# Patient Record
Sex: Male | Born: 1965 | Race: Black or African American | Hispanic: No | Marital: Single | Smoking: Former smoker
Health system: Southern US, Community
[De-identification: ages and names within clinical notes are randomized; demographics above are authoritative.]

## PROBLEM LIST (undated history)

## (undated) DIAGNOSIS — I10 Essential (primary) hypertension: Secondary | ICD-10-CM

## (undated) DIAGNOSIS — I2510 Atherosclerotic heart disease of native coronary artery without angina pectoris: Secondary | ICD-10-CM

## (undated) DIAGNOSIS — R609 Edema, unspecified: Secondary | ICD-10-CM

## (undated) DIAGNOSIS — R600 Localized edema: Secondary | ICD-10-CM

## (undated) DIAGNOSIS — R0600 Dyspnea, unspecified: Secondary | ICD-10-CM

## (undated) DIAGNOSIS — N189 Chronic kidney disease, unspecified: Secondary | ICD-10-CM

## (undated) DIAGNOSIS — I517 Cardiomegaly: Secondary | ICD-10-CM

## (undated) DIAGNOSIS — I509 Heart failure, unspecified: Secondary | ICD-10-CM

## (undated) HISTORY — DX: Edema, unspecified: R60.9

## (undated) HISTORY — DX: Localized edema: R60.0

## (undated) HISTORY — PX: NO PAST SURGERIES: SHX2092

## (undated) HISTORY — DX: Essential (primary) hypertension: I10

## (undated) HISTORY — DX: Dyspnea, unspecified: R06.00

## (undated) HISTORY — DX: Cardiomegaly: I51.7

## (undated) HISTORY — DX: Heart failure, unspecified: I50.9

## (undated) HISTORY — DX: Chronic kidney disease, unspecified: N18.9

---

## 2009-05-21 ENCOUNTER — Encounter (INDEPENDENT_AMBULATORY_CARE_PROVIDER_SITE_OTHER): Payer: Self-pay | Admitting: Internal Medicine

## 2009-05-21 ENCOUNTER — Ambulatory Visit: Payer: Self-pay | Admitting: Cardiology

## 2009-05-21 ENCOUNTER — Inpatient Hospital Stay (HOSPITAL_COMMUNITY): Admission: EM | Admit: 2009-05-21 | Discharge: 2009-05-22 | Payer: Self-pay | Admitting: Emergency Medicine

## 2010-07-09 LAB — DIFFERENTIAL
Eosinophils Absolute: 0 10*3/uL (ref 0.0–0.7)
Eosinophils Relative: 0 % (ref 0–5)
Lymphs Abs: 0.9 10*3/uL (ref 0.7–4.0)
Monocytes Absolute: 0.6 10*3/uL (ref 0.1–1.0)
Neutro Abs: 10.7 10*3/uL — ABNORMAL HIGH (ref 1.7–7.7)

## 2010-07-09 LAB — BASIC METABOLIC PANEL
BUN: 20 mg/dL (ref 6–23)
Calcium: 8.4 mg/dL (ref 8.4–10.5)
Chloride: 101 mEq/L (ref 96–112)
Creatinine, Ser: 1.61 mg/dL — ABNORMAL HIGH (ref 0.4–1.5)
GFR calc Af Amer: 58 mL/min — ABNORMAL LOW (ref >60)
GFR calc non Af Amer: 48 mL/min — ABNORMAL LOW (ref >60)

## 2010-07-09 LAB — PROTEIN / CREATININE RATIO, URINE

## 2010-07-09 LAB — BRAIN NATRIURETIC PEPTIDE: Pro B Natriuretic peptide (BNP): >3200 pg/mL — ABNORMAL HIGH (ref 0.0–100.0)

## 2010-07-09 LAB — URINALYSIS, ROUTINE W REFLEX MICROSCOPIC
Bilirubin Urine: NEGATIVE
Glucose, UA: NEGATIVE mg/dL
Protein, ur: 100 mg/dL — AB
Urobilinogen, UA: 2 mg/dL — ABNORMAL HIGH (ref 0.0–1.0)
pH: 5.5 (ref 5.0–8.0)

## 2010-07-09 LAB — D-DIMER, QUANTITATIVE: D-Dimer, Quant: 4.01 ug/mL-FEU — ABNORMAL HIGH (ref 0.00–0.48)

## 2010-07-09 LAB — GLUCOSE, CAPILLARY
Glucose-Capillary: 112 mg/dL — ABNORMAL HIGH (ref 70–99)
Glucose-Capillary: 116 mg/dL — ABNORMAL HIGH (ref 70–99)
Glucose-Capillary: 121 mg/dL — ABNORMAL HIGH (ref 70–99)
Glucose-Capillary: 129 mg/dL — ABNORMAL HIGH (ref 70–99)
Glucose-Capillary: 152 mg/dL — ABNORMAL HIGH (ref 70–99)

## 2010-07-09 LAB — COMPREHENSIVE METABOLIC PANEL
AST: 36 U/L (ref 0–37)
Alkaline Phosphatase: 105 U/L (ref 39–117)
BUN: 19 mg/dL (ref 6–23)
Calcium: 8.9 mg/dL (ref 8.4–10.5)
GFR calc non Af Amer: 55 mL/min — ABNORMAL LOW (ref >60)
Glucose, Bld: 177 mg/dL — ABNORMAL HIGH (ref 70–99)
Sodium: 136 mEq/L (ref 135–145)
Total Protein: 7.1 g/dL (ref 6.0–8.3)

## 2010-07-09 LAB — CARDIAC PANEL(CRET KIN+CKTOT+MB+TROPI)
CK, MB: 1.7 ng/mL (ref 0.3–4.0)
Troponin I: 0.1 ng/mL — ABNORMAL HIGH (ref 0.00–0.06)

## 2010-07-09 LAB — HEMOGLOBIN A1C: Hgb A1c MFr Bld: 7 % — ABNORMAL HIGH (ref 4.6–6.1)

## 2010-07-09 LAB — RAPID URINE DRUG SCREEN, HOSP PERFORMED
Barbiturates: NOT DETECTED
Tetrahydrocannabinol: NOT DETECTED

## 2010-07-09 LAB — CULTURE, BLOOD (ROUTINE X 2): Culture: NO GROWTH

## 2010-07-09 LAB — CBC
HCT: 39.5 % (ref 39.0–52.0)
Hemoglobin: 12.9 g/dL — ABNORMAL LOW (ref 13.0–17.0)
MCHC: 32.7 g/dL (ref 30.0–36.0)
MCV: 84.8 fL (ref 78.0–100.0)
Platelets: 418 10*3/uL — ABNORMAL HIGH (ref 150–400)
Platelets: 520 10*3/uL — ABNORMAL HIGH (ref 150–400)
RBC: 4.12 MIL/uL — ABNORMAL LOW (ref 4.22–5.81)
WBC: 10.7 10*3/uL — ABNORMAL HIGH (ref 4.0–10.5)
WBC: 12.2 10*3/uL — ABNORMAL HIGH (ref 4.0–10.5)

## 2010-07-09 LAB — ETHANOL: Alcohol, Ethyl (B): <5 mg/dL (ref 0–10)

## 2010-07-09 LAB — CK TOTAL AND CKMB (NOT AT ARMC): Relative Index: 0.6 (ref 0.0–2.5)

## 2010-07-09 LAB — APTT: aPTT: 32 seconds (ref 24–37)

## 2010-07-09 LAB — PROTIME-INR: Prothrombin Time: 17.4 seconds — ABNORMAL HIGH (ref 11.6–15.2)

## 2010-07-09 LAB — POCT CARDIAC MARKERS
CKMB, poc: 1.7 ng/mL (ref 1.0–8.0)
Myoglobin, poc: 245 ng/mL (ref 12–200)

## 2010-07-09 LAB — TROPONIN I: Troponin I: 0.07 ng/mL — ABNORMAL HIGH (ref 0.00–0.06)

## 2010-10-19 IMAGING — CR DG CHEST 2V
2 series · 2 of 2 positions shown · non-contrast
Comparison: None.

CLINICAL DATA: 43-year-old male with shortness of breath and cough.

CHEST - 2 VIEW

[w chest pa]
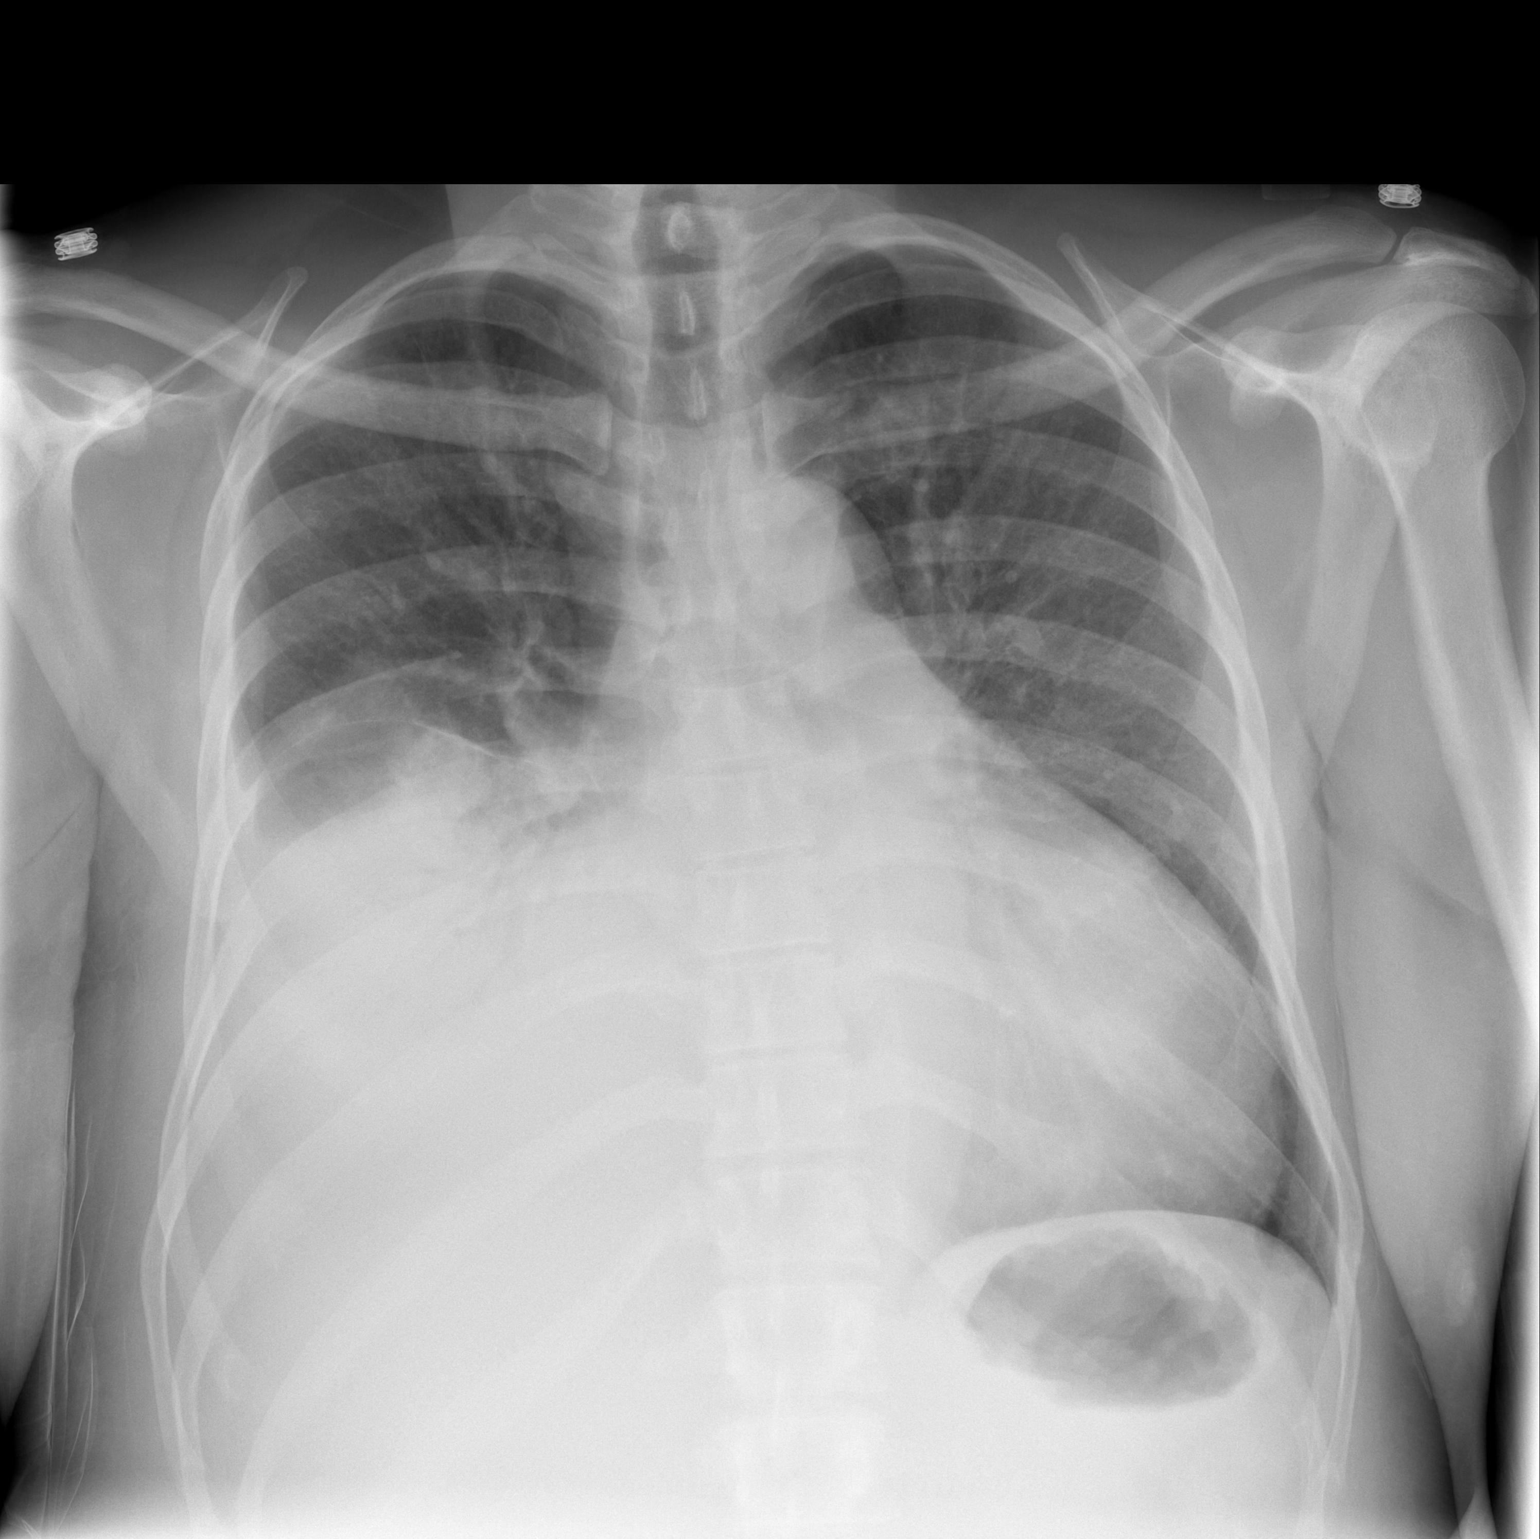

[w chest lat *]
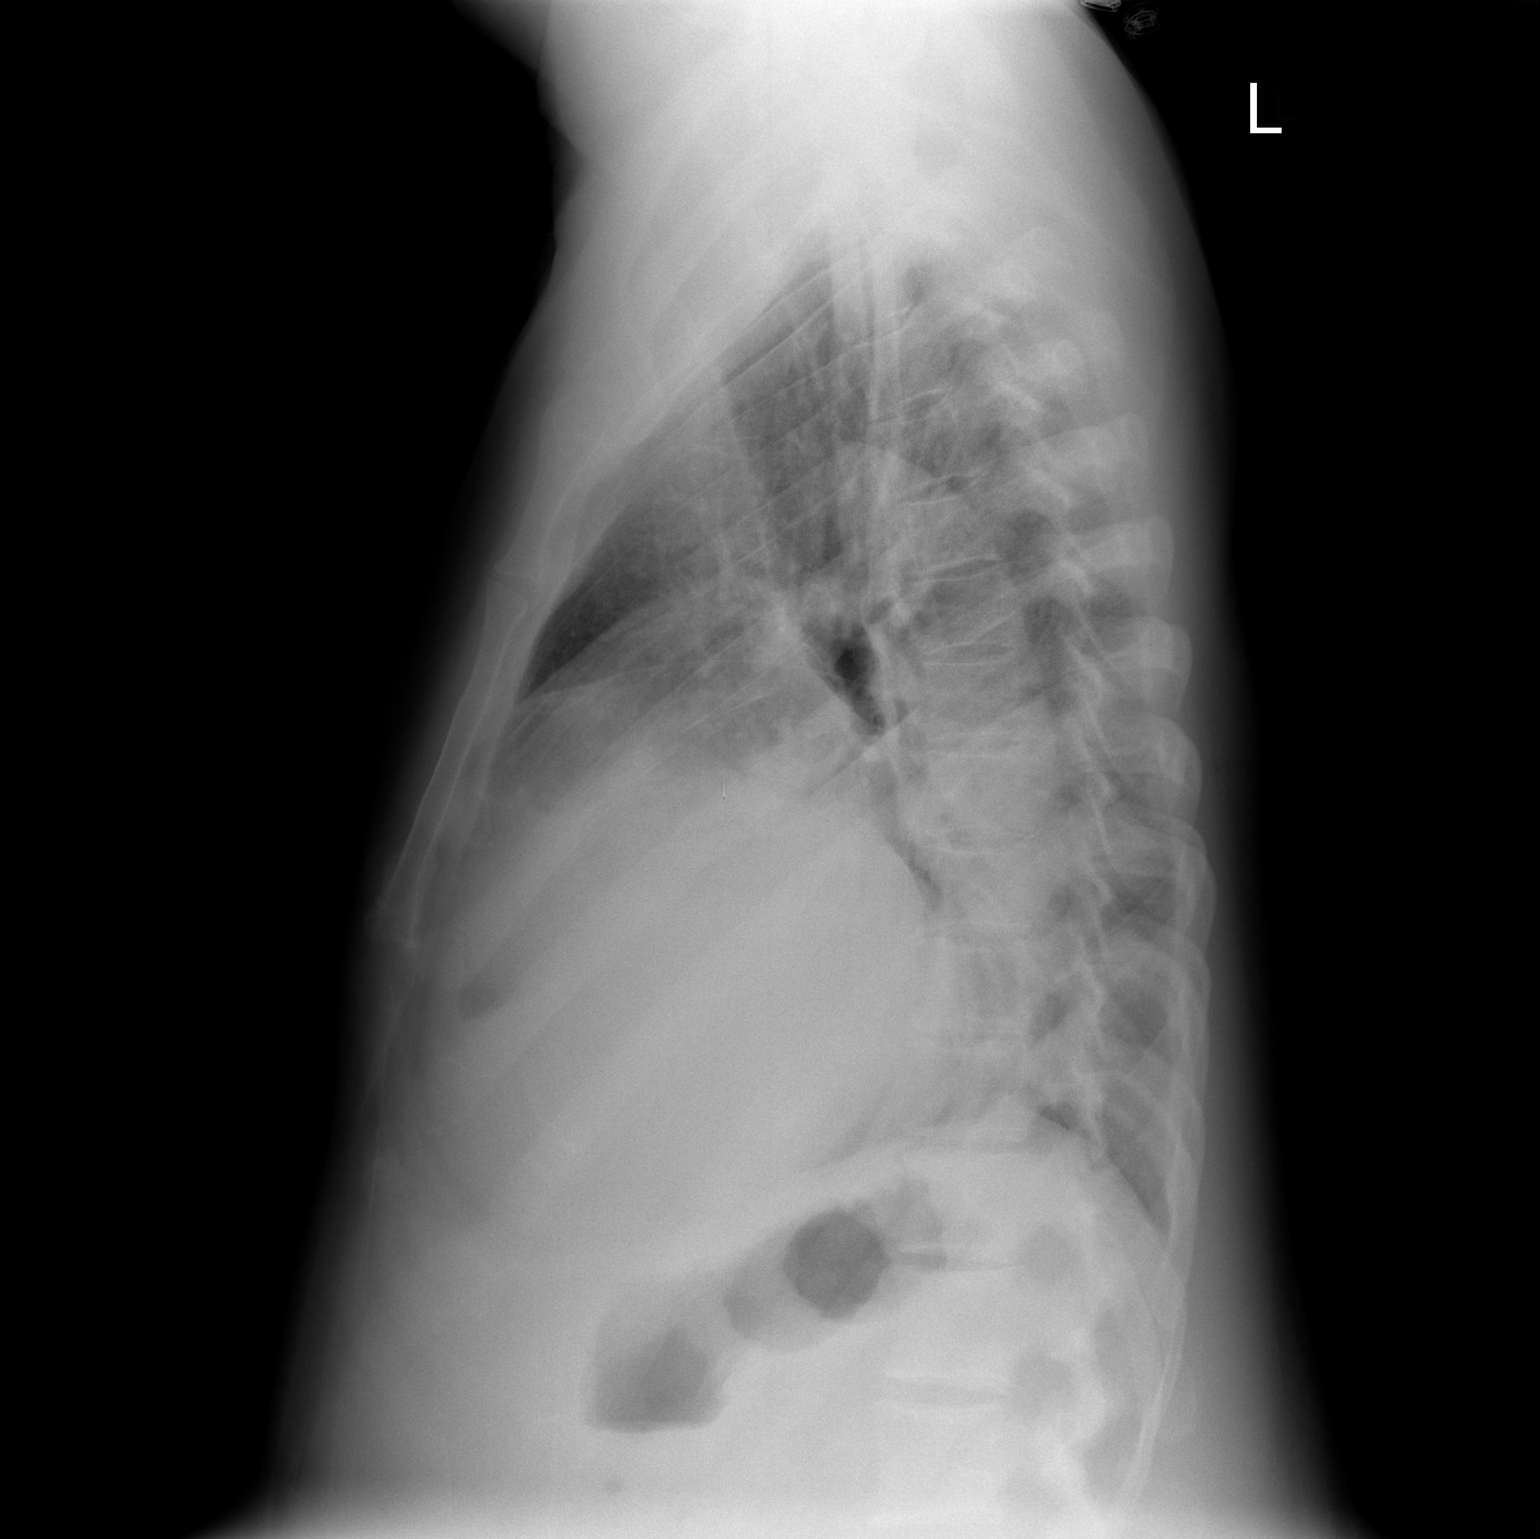

[2 of 2 positions shown; findings below may reference images not displayed]

FINDINGS: Marked cardiomegaly.  Dense opacification of the right
lung base with perihilar air bronchograms.  No pneumothorax.  Mild
increased interstitial opacity elsewhere suggestive of vascular
congestion.  No left pleural effusion.  Superior mediastinal
contours are normal. Visualized tracheal air column is within
normal limits.  No acute osseous abnormality identified.
IMPRESSION: 1.  Marked cardiomegaly.  Pericardial effusion not excluded.
2.  Dense opacification of the right middle and lower lobes with
perihilar air bronchograms.  The appearance could reflect pneumonia
and/or moderate pleural effusion.
3.  There may be mild interstitial edema superimposed.

## 2011-01-24 ENCOUNTER — Telehealth: Payer: Self-pay

## 2011-01-24 NOTE — Telephone Encounter (Signed)
Pt Signed ROI,Maile[facility] Records to pt 01/24/11/km

## 2012-11-09 ENCOUNTER — Ambulatory Visit (INDEPENDENT_AMBULATORY_CARE_PROVIDER_SITE_OTHER): Payer: Self-pay | Admitting: Cardiovascular Disease

## 2012-11-09 ENCOUNTER — Inpatient Hospital Stay (HOSPITAL_COMMUNITY)
Admission: RE | Admit: 2012-11-09 | Discharge: 2012-11-09 | Disposition: A | Discharge status: Home / Self Care | Payer: Self-pay | Source: Ambulatory Visit

## 2012-11-09 VITALS — BP 187/116 | HR 130 | Resp 28 | Ht 70.0 in | Wt 200.0 lb

## 2012-11-09 DIAGNOSIS — I5023 Acute on chronic systolic (congestive) heart failure: Secondary | ICD-10-CM

## 2012-11-09 DIAGNOSIS — I5043 Acute on chronic combined systolic (congestive) and diastolic (congestive) heart failure: Secondary | ICD-10-CM

## 2012-11-09 DIAGNOSIS — R9431 Abnormal electrocardiogram [ECG] [EKG]: Secondary | ICD-10-CM

## 2012-11-09 DIAGNOSIS — I10 Essential (primary) hypertension: Secondary | ICD-10-CM

## 2012-11-09 DIAGNOSIS — I509 Heart failure, unspecified: Secondary | ICD-10-CM

## 2012-11-09 DIAGNOSIS — Z79899 Other long term (current) drug therapy: Secondary | ICD-10-CM

## 2012-11-09 LAB — COMPREHENSIVE METABOLIC PANEL
AST: 46 U/L — ABNORMAL HIGH (ref 0–37)
Albumin: 3.2 g/dL — ABNORMAL LOW (ref 3.5–5.2)
Alkaline Phosphatase: 96 U/L (ref 39–117)
Calcium: 9.2 mg/dL (ref 8.4–10.5)
Chloride: 104 mEq/L (ref 96–112)
Glucose, Bld: 149 mg/dL — ABNORMAL HIGH (ref 70–99)
Potassium: 4.8 mEq/L (ref 3.5–5.3)
Sodium: 138 mEq/L (ref 135–145)
Total Protein: 6.6 g/dL (ref 6.0–8.3)

## 2012-11-09 MED ORDER — FUROSEMIDE 80 MG PO TABS: 80.0000 mg | ORAL_TABLET | Freq: Every day | ORAL | Status: DC

## 2012-11-09 MED ORDER — POTASSIUM CHLORIDE CRYS ER 20 MEQ PO TBCR: 20.0000 meq | EXTENDED_RELEASE_TABLET | Freq: Every day | ORAL | Status: DC

## 2012-11-09 MED ORDER — LISINOPRIL 20 MG PO TABS: 20.0000 mg | ORAL_TABLET | Freq: Every day | ORAL | Status: DC

## 2012-11-09 NOTE — Patient Instructions (Addendum)
Start Lisinopril 20mg  a daily.  Start Furosemide 80mg  daily.  Start Potassium daily.  Get labwork done today.  Return in 1 week.

## 2012-11-10 ENCOUNTER — Encounter: Payer: Self-pay | Admitting: Cardiovascular Disease

## 2012-11-10 DIAGNOSIS — I10 Essential (primary) hypertension: Secondary | ICD-10-CM | POA: Insufficient documentation

## 2012-11-10 DIAGNOSIS — I5043 Acute on chronic combined systolic (congestive) and diastolic (congestive) heart failure: Secondary | ICD-10-CM | POA: Insufficient documentation

## 2012-11-10 NOTE — Progress Notes (Signed)
Patient ID: Dwayne Cook, male   DOB: 1965/04/30, 47 y.o.   MRN: [id number]     Reason for office visit Acute CHF  Dwayne Cook is 47 years old and presents with severe acute exacerbation of pre-existing heart failure. He initially presented in 2011 with heart failure and had a brief in-hospital evaluation at [facility]. His echocardiogram showed an ejection fraction of 20% and there was evidence of at least moderate diastolic dysfunction. Coronary angiography was recommended but the patient declined. He was started on medical therapy and improved and eventually left against the advice of his physicians. He had severe systemic hypertension and proteinuria and hematuria and moderately elevated creatinine levels. From the patient and family's report it sounds like he was subsequently compliant with medical therapy for a while and did well. He changed his lifestyle. He became very judicious with use of sodium and began walking on a daily basis. He abstained from alcohol. He had pretty good functional status. Unfortunately he has been unemployed and unable to afford his medications or any followup. He is currently not taking any medications for hypertension or heart failure.  Over the last several weeks his condition has deteriorated. He initially developed shortness of breath on exertion but subsequently has had problems with orthopnea paroxysmal nocturnal dyspnea at least for several weeks. More recently has developed lower showed edema and finally allowed himself to be convinced to see a physician. It sounds like he is trying to deny many of his symptoms and the existence of his chronic illness. He seems very frustrated by the fact that his intense efforts at healthy lifestyle have not paid off. He staunchly denies any chest discomfort either in the remote or recent past.  A repeat echocardiogram performed in the office today shows an ejection fraction that is no more than 10%. The left ventricle  is moderately dilated with a end-diastolic diameter of about 6.5 cm the right heart is enlarged as well and there is at least mild right ventricular systolic dysfunction. He has mild to moderate aortic valve insufficiency, previously described as moderate in 2011. There is clear evidence of elevation in both left heart pressures and right heart pressures.  His electrocardiogram shows sinus tachycardia and left ventricular hypertrophy no Q waves are seen the QRS is slightly broadened only mild ST segment depression and T-wave inversions in leads 1 and aVL.     Allergies  Allergen Reactions  . Shellfish Allergy     Current Outpatient Prescriptions  Medication Sig Dispense Refill  . aspirin 81 MG tablet Take 81 mg by mouth daily.      . furosemide (LASIX) 80 MG tablet Take 1 tablet (80 mg total) by mouth daily.  90 tablet  3  . lisinopril (PRINIVIL,ZESTRIL) 20 MG tablet Take 1 tablet (20 mg total) by mouth daily.  90 tablet  3  . potassium chloride SA (K-DUR,KLOR-CON) 20 MEQ tablet Take 1 tablet (20 mEq total) by mouth daily.  90 tablet  3   No current facility-administered medications for this visit.    Past Medical History  Diagnosis Date  . Dyspnea   . Congestive heart failure   . Peripheral edema   . Cardiomegaly   . Hypertension   . Chronic kidney disease     History reviewed. No pertinent past surgical history.  Family History  Problem Relation Age of Onset  . Hypertension Mother     History   Social History  . Marital Status: Single    Spouse  Name: N/A    Number of Children: N/A  . Years of Education: N/A   Occupational History  . Not on file.   Social History Main Topics  . Smoking status: Former Smoker -- 1.00 packs/day for 15 years    Types: Cigarettes    Quit date: 04/18/2006  . Smokeless tobacco: Never Used  . Alcohol Use: 0 - .5 oz/week    0-1 drink(s) per week     Comment: occasional per pt  . Drug Use: No  . Sexually Active: Not on file   Other  Topics Concern  . Not on file   Social History Narrative  . No narrative on file    Review of systems: He has severe dyspnea at rest and is unable to lie flat. Even lying at 45 head of bed elevation is very difficult for him. He can only do it for a few minutes and has to sit up. He has severe dyspnea on exertion. He denies chest pain. He has not had syncope dizziness lightheadedness or focal cortical deficits. Moderate ankle edema has developed just in the last few days. He does not have intermittent claudication or rectal dysfunction. He denies flank pain or hematuria dysuria polyuria or polydipsia. He denies abdominal pain change in bowel habits dysphagia nausea or vomiting. Has not had cough wheezing or hemoptysis. He denies fever or chills. He has not been monitoring his weight. He describes several symptoms suggestive of depression but when asked whether he feels depressed he denied this. He denies any suicidal thoughts. He has not had any skin rashes.  PHYSICAL EXAM BP 187/116  Pulse 130  Resp 28  Ht 5\' 10"  (1.778 m)  Wt 200 lb (90.719 kg)  BMI 28.7 kg/m2  General: Alert, oriented x3, mild respiratory distress Head: no evidence of trauma, PERRL, EOMI, no exophtalmos or lid lag, no myxedema, no xanthelasma; normal ears, nose and oropharynx Neck: 6-7 cm elevation in jugular venous pulsations and prompt hepatojugular reflux; brisk carotid pulses without delay and no carotid bruits Chest: clear to auscultation, no signs of consolidation by percussion or palpation, normal fremitus, symmetrical and full respiratory excursions Cardiovascular: The apical impulse is laterally and inferiorly displaced and markedly hyperdynamic. There is a right parasternal heave, the liver is not palpable, regular rhythm, normal first and second heart sounds, no murmurs, loud summation gallop is heard Abdomen: no tenderness or distention, no masses by palpation, no abnormal pulsatility or arterial bruits, normal  bowel sounds, the liver is slightly enlarged a 2 cm beneath right costal margin but is not pulsatile, the spleen is not palpable Extremities: no clubbing, cyanosis; there is 2+ bilateral symmetrical pitting edema halfway up the shins; 2+ radial, ulnar and brachial pulses bilaterally; 2+ right femoral, posterior tibial and dorsalis pedis pulses; 2+ left femoral, posterior tibial and dorsalis pedis pulses; no subclavian or femoral bruits Neurological: grossly nonfocal   EKG: Sinus tachycardia, left ventricular hypertrophy with secondary repolarization abnormality, probable biatrial enlargement  Lipid Panel  No results found for this basename: chol, trig, hdl, cholhdl, vldl, ldlcalc    BMET    Component Value Date/Time   NA 138 11/09/2012 1346   K 4.8 11/09/2012 1346   CL 104 11/09/2012 1346   CO2 19 11/09/2012 1346   GLUCOSE 149* 11/09/2012 1346   BUN 17 11/09/2012 1346   CREATININE 1.73* 11/09/2012 1346   CREATININE 1.61* 05/22/2009 0420   CALCIUM 9.2 11/09/2012 1346   GFRNONAA 48* 05/22/2009 0420   GFRAA  Value:  58        The eGFR has been calculated using the MDRD equation. This calculation has not been validated in all clinical situations. eGFR's persistently <60 mL/min signify possible Chronic Kidney Disease.* 05/22/2009 2952     ASSESSMENT AND PLAN Acute on chronic combined systolic and diastolic CHF, NYHA class 4 He is severely decompensated and appears to be in chronic pulmonary edema. I have recommended hospitalization but he clearly prefers outpatient management first. He seemed to do quite well in the past when he was on high doses of ACE inhibitors and beta blockers, hopefully we can achieve a similar degree of compensation again. It is unclear whether he has idiopathic dilated cardiomyopathy and systemic hypertension or whether his cardiomyopathy is entirely secondary to malignant hypertension. He does not really have that much left ventricular hypertrophy. We will begin lisinopril 20 mg  once daily and furosemide with potassium supplements. He is in no shape to receive beta blockers yet. Reevaluate next week. Check labs today. Suspect she will need at least 3 agents for blood pressure control. Reevaluate left ventricular ejection fraction in 3 months. He may remain an ICD candidate. Life vest is a consideration, but I am not sure how we will manage the financial aspects of this. Long-term prognosis is guarded. I would favor early referral to heart failure clinic. He may require advanced heart failure therapies such as consideration for transplantation and LVAD. Reinforced the need for strict sodium restriction, daily weight monitoring and keeping a record of his weights and bring it to his next office appointment.  CKD (chronic kidney disease), stage III Laboratory tests from 3 years ago suggest he already has renal impairment. Repeat labs today. 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  HTN (hypertension), malignant He has severe systemic hypertension. We'll start off with ACE inhibitors implant to try to titrate these to maximum dosage if renal function allows. Then will add carvedilol once he is closer to euvolemic status. He may require a third agent such as amlodipine or for better yet, hydralazine nitrates.   Orders Placed This Encounter  Procedures  . Comp Met (CMET)  . 2D Echocardiogram with contrast   Meds ordered this encounter  Medications  . lisinopril (PRINIVIL,ZESTRIL) 20 MG tablet    Sig: Take 1 tablet (20 mg total) by mouth daily.    Dispense:  90 tablet    Refill:  3  . furosemide (LASIX) 80 MG tablet    Sig: Take 1 tablet (80 mg total) by mouth daily.    Dispense:  90 tablet    Refill:  3  . potassium chloride SA (K-DUR,KLOR-CON) 20 MEQ tablet    Sig: Take 1 tablet (20 mEq total) by mouth daily.    Dispense:  90 tablet    Refill:  3  . aspirin 81 MG tablet    Sig: Take 81 mg by mouth daily.    Junious Silk, MD,  [facility] [phone] office [phone] pager

## 2012-11-10 NOTE — Assessment & Plan Note (Signed)
Laboratory tests from 3 years ago suggest he already has renal impairment. Repeat labs today. 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

## 2012-11-10 NOTE — Assessment & Plan Note (Signed)
He has severe systemic hypertension. We'll start off with ACE inhibitors implant to try to titrate these to maximum dosage if renal function allows. Then will add carvedilol once he is closer to euvolemic status. He may require a third agent such as amlodipine or for better yet, hydralazine nitrates.

## 2012-11-10 NOTE — Assessment & Plan Note (Addendum)
He is severely decompensated and appears to be in chronic pulmonary edema. I have recommended hospitalization but he clearly prefers outpatient management first. He seemed to do quite well in the past when he was on high doses of ACE inhibitors and beta blockers, hopefully we can achieve a similar degree of compensation again. It is unclear whether he has idiopathic dilated cardiomyopathy and systemic hypertension or whether his cardiomyopathy is entirely secondary to malignant hypertension. He does not really have that much left ventricular hypertrophy. We will begin lisinopril 20 mg once daily and furosemide with potassium supplements. He is in no shape to receive beta blockers yet. Reevaluate next week. Check labs today. Suspect she will need at least 3 agents for blood pressure control. Reevaluate left ventricular ejection fraction in 3 months. He may remain an ICD candidate. Life vest is a consideration, but I am not sure how we will manage the financial aspects of this. Long-term prognosis is guarded. I would favor early referral to heart failure clinic. He may require advanced heart failure therapies such as consideration for transplantation and LVAD. Reinforced the need for strict sodium restriction, daily weight monitoring and keeping a record of his weights and bring it to his next office appointment.

## 2012-11-14 ENCOUNTER — Encounter: Payer: Self-pay | Admitting: Cardiovascular Disease

## 2012-11-14 ENCOUNTER — Ambulatory Visit: Payer: Self-pay | Admitting: Cardiovascular Disease

## 2012-11-20 ENCOUNTER — Ambulatory Visit (INDEPENDENT_AMBULATORY_CARE_PROVIDER_SITE_OTHER): Payer: Self-pay | Admitting: Cardiovascular Disease

## 2012-11-20 ENCOUNTER — Encounter: Payer: Self-pay | Admitting: Cardiovascular Disease

## 2012-11-20 VITALS — BP 166/100 | HR 124 | Resp 16 | Ht 70.0 in | Wt 194.0 lb

## 2012-11-20 DIAGNOSIS — I10 Essential (primary) hypertension: Secondary | ICD-10-CM

## 2012-11-20 DIAGNOSIS — I5021 Acute systolic (congestive) heart failure: Secondary | ICD-10-CM

## 2012-11-20 DIAGNOSIS — I5043 Acute on chronic combined systolic (congestive) and diastolic (congestive) heart failure: Secondary | ICD-10-CM

## 2012-11-20 DIAGNOSIS — I509 Heart failure, unspecified: Secondary | ICD-10-CM

## 2012-11-20 MED ORDER — CARVEDILOL 6.25 MG PO TABS: 6.2500 mg | ORAL_TABLET | Freq: Two times a day (BID) | ORAL | Status: DC

## 2012-11-20 NOTE — Patient Instructions (Addendum)
Your physician has recommended you make the following change in your medication: start carvedilol 3.125 mg twice a day (half a tablet twice a day). Your physician recommends that you weigh, daily, at the same time every day, and in the same amount of clothing. Please record your daily weights on the handout provided and bring it to your next appointment.  Please call right away if you develop worsening swelling, shortness of breath, dizziness or blackouts.  Your physician recommends that you schedule a follow-up appointment in: 1 month

## 2012-11-21 ENCOUNTER — Encounter: Payer: Self-pay | Admitting: Cardiovascular Disease

## 2012-11-21 NOTE — Assessment & Plan Note (Addendum)
He has severe left ventricular systolic dysfunction with an ejection fraction of less than 15%. When he initially presented he was markedly orthopneic and had paroxysmal nocturnal dyspnea. Both of these symptoms have now resolved. He is able to lie flat in bed and sleep. At home his heart rate was 90 beats per minutes but his blood pressure was still elevated. He seems to be really apprehensive about being in any physician's office. Today he again looks very nervous but is clearly breathing more comfortably. His jugular venous pulsations are not elevated and he has had no further problems with edema. He seems to be close to euvolemic status.  I am rather concerned about the fact that he is so tachycardic today, but I believe that the beneficial effects of the beta blocker related blood pressure reduction will outweigh the immediate acute negative inotropic effects. All in all, I think he'll benefit from starting carvedilol. He'll begin 3.125 mg twice daily, although I gave him a prescription for the 6.25 mg tablets which he will have to break in half. I told him that he may transiently feel worse after starting this treatment and asked him to immediately call should he develop worsening shortness of breath, recurrent edema, orthopnea or PND. I doubt that he will become hypotensive on such a small dose. We will plan to increase his dose to 6.25 mg twice a day in 2 weeks if he tolerates beta blocker initiation well. Otherwise we may leave him at the current low dose in increase the ACE inhibitor or add hydralazine/nitrate. He is to continue weighing himself daily. On his home scale his weight is 190 pounds, on office scale 194 pounds

## 2012-11-21 NOTE — Assessment & Plan Note (Signed)
Recheck a metabolic panel at his next appointment

## 2012-11-21 NOTE — Assessment & Plan Note (Signed)
His blood pressure today severely elevated. Checked at home by his daughter who is a CMA, his blood pressure has been as low as 120/90 mm Hg.

## 2012-11-21 NOTE — Progress Notes (Signed)
Patient ID: Dwayne Cook, male   DOB: 05-21-1965, 47 y.o.   MRN: [id number]     Reason for office visit Congestive heart failure follow up  On once a daily 40 mg furosemide treatment he did not have significant diuresis so we increased it to 80 mg daily. Also increase his lisinopril dose after his last visit. He has had significant diuresis. As lost about 10 pounds of fluid. He no longer has orthopnea and are social and dyspnea.  His blood pressure has decreased gradually but the lowest recorded was 120/19 mm Hg at home. His heart rate has been as low as 90 beats per minute at home. Today he is tachycardic.    Allergies  Allergen Reactions  . Shellfish Allergy     Current Outpatient Prescriptions  Medication Sig Dispense Refill  . aspirin 81 MG tablet Take 81 mg by mouth daily.      . furosemide (LASIX) 80 MG tablet Take 1 tablet (80 mg total) by mouth daily.  90 tablet  3  . lisinopril (PRINIVIL,ZESTRIL) 20 MG tablet Take 1 tablet (20 mg total) by mouth daily.  90 tablet  3  . potassium chloride SA (K-DUR,KLOR-CON) 20 MEQ tablet Take 1 tablet (20 mEq total) by mouth daily.  90 tablet  3  . carvedilol (COREG) 6.25 MG tablet Take 1 tablet (6.25 mg total) by mouth 2 (two) times daily.  180 tablet  3   No current facility-administered medications for this visit.    Past Medical History  Diagnosis Date  . Dyspnea   . Congestive heart failure   . Peripheral edema   . Cardiomegaly   . Hypertension   . Chronic kidney disease     No past surgical history on file.  Family History  Problem Relation Age of Onset  . Hypertension Mother     History   Social History  . Marital Status: Single    Spouse Name: N/A    Number of Children: N/A  . Years of Education: N/A   Occupational History  . Not on file.   Social History Main Topics  . Smoking status: Former Smoker -- 1.00 packs/day for 15 years    Types: Cigarettes    Quit date: 04/18/2006  . Smokeless tobacco: Never  Used  . Alcohol Use: 0 - .5 oz/week    0-1 drink(s) per week     Comment: occasional per pt  . Drug Use: No  . Sexually Active: Not on file   Other Topics Concern  . Not on file   Social History Narrative  . No narrative on file    Review of systems: The patient specifically denies any chest pain at rest or with exertion,  orthopnea, paroxysmal nocturnal dyspnea, syncope, palpitations, focal neurological deficits, intermittent claudication, lower extremity edema, unexplained weight gain, cough, hemoptysis or wheezing.  The patient also denies abdominal pain, nausea, vomiting, dysphagia, diarrhea, constipation, polyuria, polydipsia, dysuria, hematuria, frequency, urgency, abnormal bleeding or bruising, fever, chills, unexpected weight changes, mood swings, change in skin or hair texture, change in voice quality, auditory or visual problems, allergic reactions or rashes, new musculoskeletal complaints other than usual "aches and pains".   PHYSICAL EXAM BP 166/100  Pulse 124  Resp 16  Ht 5\' 10"  (1.778 m)  Wt 194 lb (87.998 kg)  BMI 27.84 kg/m2  General: Alert, oriented x3, no distress Head: no evidence of trauma, PERRL, EOMI, no exophtalmos or lid lag, no myxedema, no xanthelasma; normal ears, nose and oropharynx  Neck: normal jugular venous pulsations and no hepatojugular reflux; brisk carotid pulses without delay and no carotid bruits Chest: clear to auscultation, no signs of consolidation by percussion or palpation, normal fremitus, symmetrical and full respiratory excursions Cardiovascular: Tachycardic Laterally and inferiorly displaced and effaced apical impulse, parasternal impulse has diminished, regular rhythm, normal first and second heart sounds, no murmurs, rubs or gallops Abdomen: no tenderness or distention, no masses by palpation, no abnormal pulsatility or arterial bruits, normal bowel sounds, no hepatosplenomegaly Extremities: no clubbing, cyanosis or edema; 2+ radial,  ulnar and brachial pulses bilaterally; 2+ right femoral, posterior tibial and dorsalis pedis pulses; 2+ left femoral, posterior tibial and dorsalis pedis pulses; no subclavian or femoral bruits Neurological: grossly nonfocal  EKG: Sinus tachycardia, left ventricular hypertrophy with repolarization abnormalities.  Lipid Panel    BMET    Component Value Date/Time   NA 138 11/09/2012 1346   K 4.8 11/09/2012 1346   CL 104 11/09/2012 1346   CO2 19 11/09/2012 1346   GLUCOSE 149* 11/09/2012 1346   BUN 17 11/09/2012 1346   CREATININE 1.73* 11/09/2012 1346   CREATININE 1.61* 05/22/2009 0420   CALCIUM 9.2 11/09/2012 1346   GFRNONAA 48* 05/22/2009 0420   GFRAA  Value: 58        The eGFR has been calculated using the MDRD equation. This calculation has not been validated in all clinical situations. eGFR's persistently <60 mL/min signify possible Chronic Kidney Disease.* 05/22/2009 1610     ASSESSMENT AND PLAN Acute on chronic combined systolic and diastolic CHF, NYHA class 3 He has severe left ventricular systolic dysfunction with an ejection fraction of less than 15%. When he initially presented he was markedly orthopneic and had paroxysmal nocturnal dyspnea. Both of these symptoms have now resolved. He is able to lie flat in bed and sleep. At home his heart rate was 90 beats per minutes but his blood pressure was still quite elevated. He seems to be really apprehensive about being in any physician's office. Today he again looks very nervous but is clearly breathing more comfortably. His jugular venous pulsations are not elevated and he has had no further problems with edema. He seems to be close to euvolemic status. I am rather concerned about the fact that he is so tachycardic today, but I believe that the beneficial effects of the beta blocker related blood pressure reduction will outweigh the immediate acute negative inotropic effects. All in all I think you'll benefit from starting carvedilol. He'll begin  3.125 mg twice daily, although I gave him a prescription for the 6.25 mg tablets which she will have to break in half. I told him that he may transiently feel worse after starting this treatment and asked him to immediately call should he develop worsening shortness of breath, recurrent edema, orthopnea or PND. I doubt that he will become hypotensive on such a small dose. We will plan to increase his dose to 6.25 mg twice a day in 2 weeks if he tolerates beta blocker initiation well. Otherwise we may leave him at the current low dose in increase the ACE inhibitor or add hydralazine/nitrate.  CKD (chronic kidney disease), stage III Recheck a metabolic panel at his next appointment  HTN (hypertension), malignant His blood pressure today severely elevated. Checked at home by his daughter who is a CMA, his blood pressure has been as low as 120/90 mm Hg.  Orders Placed This Encounter  Procedures  . EKG 12-Lead   Meds ordered this encounter  Medications  .  carvedilol (COREG) 6.25 MG tablet    Sig: Take 1 tablet (6.25 mg total) by mouth 2 (two) times daily.    Dispense:  180 tablet    Refill:  3    Gorman Safi  [name], MD, [facility] [phone] office [phone] pager

## 2012-11-27 ENCOUNTER — Telehealth: Payer: Self-pay | Admitting: *Deleted

## 2012-11-27 MED ORDER — ISOSORB DINITRATE-HYDRALAZINE 20-37.5 MG PO TABS: 1.0000 | ORAL_TABLET | Freq: Three times a day (TID) | ORAL | Status: DC

## 2012-11-27 NOTE — Telephone Encounter (Signed)
Samples of Bidil given

## 2012-11-30 ENCOUNTER — Other Ambulatory Visit: Payer: Self-pay | Admitting: *Deleted

## 2012-11-30 MED ORDER — LISINOPRIL 40 MG PO TABS: 40.0000 mg | ORAL_TABLET | Freq: Every day | ORAL | Status: DC

## 2012-12-21 ENCOUNTER — Telehealth: Payer: Self-pay | Admitting: *Deleted

## 2012-12-21 DIAGNOSIS — Z79899 Other long term (current) drug therapy: Secondary | ICD-10-CM

## 2012-12-21 NOTE — Telephone Encounter (Signed)
Per dr C order bmp

## 2012-12-22 LAB — BASIC METABOLIC PANEL
BUN: 21 mg/dL (ref 6–23)
Creat: 1.7 mg/dL — ABNORMAL HIGH (ref 0.50–1.35)
Potassium: 4.9 mEq/L (ref 3.5–5.3)

## 2012-12-25 ENCOUNTER — Ambulatory Visit: Payer: Self-pay | Admitting: Cardiovascular Disease

## 2013-02-26 ENCOUNTER — Telehealth: Payer: Self-pay | Admitting: Cardiovascular Disease

## 2013-02-26 MED ORDER — HYDRALAZINE HCL 50 MG PO TABS: 50.0000 mg | ORAL_TABLET | Freq: Three times a day (TID) | ORAL | Status: DC

## 2013-02-26 MED ORDER — ISOSORBIDE DINITRATE 20 MG PO TABS: 30.0000 mg | ORAL_TABLET | Freq: Three times a day (TID) | ORAL | Status: DC

## 2013-02-26 NOTE — Telephone Encounter (Signed)
Change bidil to generic drugs

## 2013-03-12 ENCOUNTER — Ambulatory Visit (INDEPENDENT_AMBULATORY_CARE_PROVIDER_SITE_OTHER): Payer: Self-pay | Admitting: Cardiovascular Disease

## 2013-03-12 ENCOUNTER — Encounter: Payer: Self-pay | Admitting: Cardiovascular Disease

## 2013-03-12 VITALS — BP 188/128 | HR 117 | Ht 70.0 in | Wt 187.2 lb

## 2013-03-12 DIAGNOSIS — I10 Essential (primary) hypertension: Secondary | ICD-10-CM

## 2013-03-12 DIAGNOSIS — I509 Heart failure, unspecified: Secondary | ICD-10-CM

## 2013-03-12 DIAGNOSIS — N183 Chronic kidney disease, stage 3 unspecified: Secondary | ICD-10-CM

## 2013-03-12 DIAGNOSIS — I5043 Acute on chronic combined systolic (congestive) and diastolic (congestive) heart failure: Secondary | ICD-10-CM

## 2013-03-12 MED ORDER — CARVEDILOL 25 MG PO TABS: 25.0000 mg | ORAL_TABLET | Freq: Two times a day (BID) | ORAL | Status: DC

## 2013-03-12 NOTE — Patient Instructions (Signed)
Dr. Royann Shivers has requested that you have an echocardiogram. Echocardiography is a painless test that uses sound waves to create images of your heart. It provides your doctor with information about the size and shape of your heart and how well your heart's chambers and valves are working. This procedure takes approximately one hour. There are no restrictions for this procedure.  Schedule an appointment the same day as your echocardiogram.

## 2013-03-16 NOTE — Assessment & Plan Note (Signed)
Dwayne Cook appears to be over the worst of the acute heart failure but still has physical findings suggestive of severe cardiomyopathy and elevated filling pressures. He seems to have exclusively left heart failure findings at this time. His blood pressure remains unacceptably high. We will increase his carvedilol to 25 mg twice a day. When we last increased the carvedilol he had a brief period with nausea but overcame this. If his blood pressure still high we'll also add Spiriva lactone. This has to be done very cautiously during simultaneous treatment with an ACE inhibitor and a baseline creatinine of 1.7. The cause of his cardiomyopathy appears to be most likely untreated malignant hypertension, but he has never undergone evaluation for coronary disease. 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 Discussed the fact it would need to reevaluate left ventricular ejection fraction and then make a decision regarding defibrillator therapy. Prior to implantation of a defibrillator it would still be wise to do coronary angiography. This could be done on the same admission. I do not think he will ever be able to be gainfully employed in a profession that involves even the slightest physical activity or even a "sit down" job. I think he should pursue disability.

## 2013-03-16 NOTE — Assessment & Plan Note (Signed)
Creatinine stable around 1.7, GFR of approximately 50 mL per minute

## 2013-03-16 NOTE — Progress Notes (Signed)
Patient ID: Dashton Czerwinski, male   DOB: 1965-05-24, 47 y.o.   MRN: [id number]      Reason for office visit Followup congestive heart failure  Mr. Flett returns for routine followup and has not had any new adverse events. The subjectively he has noted marked improvement. Today his blood pressure heart rate are high, but as always he states that he is very nervous in the doctor's office. He did not take his carvedilol gets today. At home his daughter, who is a CMA has recorded blood pressures are typically around 120/94 mm Hg and his heart rate is usually 80-90 beats per minute. He denies issues with syncope or dizziness. He has not had edema. He does not have orthopnea or PND. He has not had palpitations. He puts on a brace face and wants to deny symptoms but he appears to have functional class III shortness of breath on exertion. He became short of breath walking from the waiting area to the examination room today.   Allergies  Allergen Reactions  . Shellfish Allergy     Current Outpatient Prescriptions  Medication Sig Dispense Refill  . aspirin 81 MG tablet Take 325 mg by mouth daily.       . furosemide (LASIX) 80 MG tablet Take 1 tablet (80 mg total) by mouth daily.  90 tablet  3  . hydrALAZINE (APRESOLINE) 50 MG tablet Take 1 tablet (50 mg total) by mouth 3 (three) times daily.  90 tablet  11  . isosorbide dinitrate (ISORDIL) 20 MG tablet Take 20 mg by mouth 3 (three) times daily.      Marland Kitchen lisinopril (PRINIVIL,ZESTRIL) 40 MG tablet Take 1 tablet (40 mg total) by mouth daily.  30 tablet  11  . potassium chloride SA (K-DUR,KLOR-CON) 20 MEQ tablet Take 1 tablet (20 mEq total) by mouth daily.  90 tablet  3  . carvedilol (COREG) 25 MG tablet Take 1 tablet (25 mg total) by mouth 2 (two) times daily.  60 tablet  6   No current facility-administered medications for this visit.    Past Medical History  Diagnosis Date  . Dyspnea   . Congestive heart failure   . Peripheral edema   .  Cardiomegaly   . Hypertension   . Chronic kidney disease     No past surgical history on file.  Family History  Problem Relation Age of Onset  . Hypertension Mother     History   Social History  . Marital Status: Single    Spouse Name: N/A    Number of Children: N/A  . Years of Education: N/A   Occupational History  . Not on file.   Social History Main Topics  . Smoking status: Former Smoker -- 1.00 packs/day for 15 years    Types: Cigarettes    Quit date: 04/18/2006  . Smokeless tobacco: Never Used  . Alcohol Use: 0 - .5 oz/week    0-1 drink(s) per week     Comment: occasional per pt  . Drug Use: No  . Sexual Activity: Not on file   Other Topics Concern  . Not on file   Social History Narrative  . No narrative on file    Review of systems: The patient specifically denies any chest pain at rest or with exertion, dyspnea at rest or with exertion, orthopnea, paroxysmal nocturnal dyspnea, syncope, palpitations, focal neurological deficits, intermittent claudication, lower extremity edema, unexplained weight gain, cough, hemoptysis or wheezing.  The patient also denies abdominal pain, nausea,  vomiting, dysphagia, diarrhea, constipation, polyuria, polydipsia, dysuria, hematuria, frequency, urgency, abnormal bleeding or bruising, fever, chills, unexpected weight changes, mood swings, change in skin or hair texture, change in voice quality, auditory or visual problems, allergic reactions or rashes, new musculoskeletal complaints other than usual "aches and pains".   PHYSICAL EXAM BP 188/128  Pulse 117  Ht 5\' 10"  (1.778 m)  Wt 187 lb 3.2 oz (84.913 kg)  BMI 26.86 kg/m2  General: Alert, oriented x3, no distress Head: no evidence of trauma, PERRL, EOMI, no exophtalmos or lid lag, no myxedema, no xanthelasma; normal ears, nose and oropharynx Neck: normal jugular venous pulsations and no hepatojugular reflux; brisk carotid pulses without delay and no carotid bruits Chest:  clear to auscultation, no signs of consolidation by percussion or palpation, normal fremitus, symmetrical and full respiratory excursions Cardiovascular: the apical impulse is displaced inferiorly to the seventh intercostal space in the anterior axillary line and has a heating quality, there is also a precordial heave, regular rhythm, normal first and second heart sounds, no murmurs, summation gallop is present Abdomen: no tenderness or distention, no masses by palpation, no abnormal pulsatility or arterial bruits, normal bowel sounds, no hepatosplenomegaly Extremities: no clubbing, cyanosis or edema; 2+ radial, ulnar and brachial pulses bilaterally; 2+ right femoral, posterior tibial and dorsalis pedis pulses; 2+ left femoral, posterior tibial and dorsalis pedis pulses; no subclavian or femoral bruits Neurological: grossly nonfocal   EKG: Sinus tachycardia, LVH, prominent repolarization abnormalities.  Lipid Panel  No results found for this basename: chol, trig, hdl, cholhdl, vldl, ldlcalc    BMET    Component Value Date/Time   NA 140 12/21/2012 1518   K 4.9 12/21/2012 1518   CL 106 12/21/2012 1518   CO2 24 12/21/2012 1518   GLUCOSE 88 12/21/2012 1518   BUN 21 12/21/2012 1518   CREATININE 1.70* 12/21/2012 1518   CREATININE 1.61* 05/22/2009 0420   CALCIUM 10.1 12/21/2012 1518   GFRNONAA 48* 05/22/2009 0420   GFRAA  Value: 58        The eGFR has been calculated using the MDRD equation. This calculation has not been validated in all clinical situations. eGFR's persistently <60 mL/min signify possible Chronic Kidney Disease.* 05/22/2009 1610     ASSESSMENT AND PLAN Acute on chronic combined systolic and diastolic CHF, NYHA class 3 Mr. Voorhies appears to be over the worst of the acute heart failure but still has physical findings suggestive of severe cardiomyopathy and elevated filling pressures. He seems to have exclusively left heart failure findings at this time. His blood pressure remains unacceptably  high. We will increase his carvedilol to 25 mg twice a day. When we last increased the carvedilol he had a brief period with nausea but overcame this. If his blood pressure still high we'll also add Spiriva lactone. This has to be done very cautiously during simultaneous treatment with an ACE inhibitor and a baseline creatinine of 1.7. The cause of his cardiomyopathy appears to be most likely untreated malignant hypertension, but he has never undergone evaluation for coronary disease. 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 Discussed the fact it would need to reevaluate left ventricular ejection fraction and then make a decision regarding defibrillator therapy. Prior to implantation of a defibrillator it would still be wise to do coronary angiography. This could be done on the same admission. I do not think he will ever be able to be gainfully employed in a profession that involves even the slightest physical activity  or even a "sit down" job. I think he should pursue disability.  CKD (chronic kidney disease), stage III Creatinine stable around 1.7, GFR of approximately 50 mL per minute  HTN (hypertension), malignant     Orders Placed This Encounter  Procedures  . EKG 12-Lead  . 2D Echocardiogram without contrast   Meds ordered this encounter  Medications  . DISCONTD: carvedilol (COREG) 6.25 MG tablet    Sig: Take 12.5 mg by mouth 2 (two) times daily.  . isosorbide dinitrate (ISORDIL) 20 MG tablet    Sig: Take 20 mg by mouth 3 (three) times daily.  . carvedilol (COREG) 25 MG tablet    Sig: Take 1 tablet (25 mg total) by mouth 2 (two) times daily.    Dispense:  60 tablet    Refill:  6    Claudia Greenley  [name], MD, [facility] [phone] office [phone] pager

## 2013-03-29 ENCOUNTER — Other Ambulatory Visit: Payer: Self-pay | Admitting: *Deleted

## 2013-03-29 ENCOUNTER — Other Ambulatory Visit: Payer: Self-pay | Admitting: Cardiovascular Disease

## 2013-03-29 DIAGNOSIS — Z79899 Other long term (current) drug therapy: Secondary | ICD-10-CM

## 2013-03-29 DIAGNOSIS — R0602 Shortness of breath: Secondary | ICD-10-CM

## 2013-03-29 LAB — BASIC METABOLIC PANEL
Chloride: 104 mEq/L (ref 96–112)
Potassium: 4.5 mEq/L (ref 3.5–5.3)
Sodium: 144 mEq/L (ref 135–145)

## 2013-03-30 LAB — BRAIN NATRIURETIC PEPTIDE: Brain Natriuretic Peptide: 1319.3 pg/mL — ABNORMAL HIGH (ref 0.0–100.0)

## 2013-04-03 ENCOUNTER — Ambulatory Visit (HOSPITAL_COMMUNITY): Payer: Self-pay

## 2013-04-03 ENCOUNTER — Ambulatory Visit: Payer: Self-pay | Admitting: Cardiovascular Disease

## 2013-04-30 ENCOUNTER — Other Ambulatory Visit: Payer: Self-pay | Admitting: *Deleted

## 2013-04-30 MED ORDER — ISOSORBIDE MONONITRATE ER 60 MG PO TB24: 30.0000 mg | ORAL_TABLET | Freq: Two times a day (BID) | ORAL | Status: DC

## 2013-05-22 ENCOUNTER — Telehealth (HOSPITAL_COMMUNITY): Payer: Self-pay | Admitting: *Deleted

## 2013-05-22 ENCOUNTER — Other Ambulatory Visit: Payer: Self-pay | Admitting: Cardiovascular Disease

## 2013-05-28 ENCOUNTER — Telehealth (HOSPITAL_COMMUNITY): Payer: Self-pay | Admitting: *Deleted

## 2013-09-19 ENCOUNTER — Other Ambulatory Visit: Payer: Self-pay | Admitting: Cardiovascular Disease

## 2013-09-19 DIAGNOSIS — Z79899 Other long term (current) drug therapy: Secondary | ICD-10-CM

## 2013-09-19 MED ORDER — METOLAZONE 2.5 MG PO TABS: 2.5000 mg | ORAL_TABLET | Freq: Every day | ORAL | Status: DC

## 2013-09-20 LAB — BASIC METABOLIC PANEL
BUN: 22 mg/dL (ref 6–23)
CALCIUM: 9.2 mg/dL (ref 8.4–10.5)
CO2: 25 mEq/L (ref 19–32)
CREATININE: 1.64 mg/dL — AB (ref 0.50–1.35)
Chloride: 102 mEq/L (ref 96–112)
GLUCOSE: 80 mg/dL (ref 70–99)
POTASSIUM: 4 meq/L (ref 3.5–5.3)
Sodium: 139 mEq/L (ref 135–145)

## 2013-09-23 ENCOUNTER — Encounter: Payer: Self-pay | Admitting: Cardiovascular Disease

## 2013-09-23 ENCOUNTER — Ambulatory Visit (INDEPENDENT_AMBULATORY_CARE_PROVIDER_SITE_OTHER): Payer: Self-pay | Admitting: Cardiovascular Disease

## 2013-09-23 ENCOUNTER — Ambulatory Visit (HOSPITAL_COMMUNITY)
Admission: RE | Admit: 2013-09-23 | Discharge: 2013-09-23 | Disposition: A | Discharge status: Home / Self Care | Payer: Self-pay | Source: Ambulatory Visit | Attending: Cardiovascular Disease | Admitting: Cardiovascular Disease

## 2013-09-23 VITALS — BP 142/92 | HR 111 | Ht 70.0 in | Wt 181.2 lb

## 2013-09-23 DIAGNOSIS — R0602 Shortness of breath: Secondary | ICD-10-CM

## 2013-09-23 DIAGNOSIS — I82409 Acute embolism and thrombosis of unspecified deep veins of unspecified lower extremity: Secondary | ICD-10-CM | POA: Insufficient documentation

## 2013-09-23 DIAGNOSIS — I10 Essential (primary) hypertension: Secondary | ICD-10-CM

## 2013-09-23 DIAGNOSIS — R000 Tachycardia, unspecified: Secondary | ICD-10-CM

## 2013-09-23 DIAGNOSIS — M7989 Other specified soft tissue disorders: Secondary | ICD-10-CM | POA: Insufficient documentation

## 2013-09-23 DIAGNOSIS — I5043 Acute on chronic combined systolic (congestive) and diastolic (congestive) heart failure: Secondary | ICD-10-CM

## 2013-09-23 DIAGNOSIS — N183 Chronic kidney disease, stage 3 unspecified: Secondary | ICD-10-CM

## 2013-09-23 DIAGNOSIS — I509 Heart failure, unspecified: Secondary | ICD-10-CM

## 2013-09-23 MED ORDER — HYDRALAZINE HCL 50 MG PO TABS: 75.0000 mg | ORAL_TABLET | Freq: Three times a day (TID) | ORAL | Status: DC

## 2013-09-23 NOTE — Patient Instructions (Signed)
Increase Hydralazine to 50mg  1 1/2 tablet three times a day.  Your physician has requested that you have a lower extremity arterial exercise duplex TODAY. During this test, exercise and ultrasound are used to evaluate arterial blood flow in the legs. Allow one hour for this exam. There are no restrictions or special instructions.  Dr. Royann Shivers recommends that you schedule a follow-up appointment in: 3 months.

## 2013-09-23 NOTE — Progress Notes (Signed)
Right Lower Ext. Venous Duplex Completed. Marilynne Halsted, BS, RDMS, RVT

## 2013-09-29 NOTE — Progress Notes (Signed)
Patient ID: Dwayne Cook, male   DOB: 1965-12-27, 48 y.o.   MRN: [id number]      Reason for office visit Acute on chronic systolic heart failure  Mr. Mederos has severe cardiomyopathy of uncertain etiology with a left ventricular ejection fraction under 20%. He has severe systemic hypertension and this is the most likely cause of his heart failure. He appears to have malignant hypertension. He has not undergone coronary angiography. He has never complained of angina pectoris. Aggressive workup has been limited by financial limitations and chronic kidney disease with a creatinine of 1.6 and estimated GFR of under 50 mL per minute (EKG stage III).  Usually has class IIIB symptoms of dyspnea on exertion, but recently he developed orthopnea and worsening lower extremity edema, progressing to class IV. Metolazone was added and he has improved. His weight today is actually 6 pounds less than it was last November With the hot and humid weather definitely makes his breathing worse. He presents with markedly asymmetric edema much more prominent on the right side than on the left. The asymmetry has become more evident as he has been slowly losing fluid.  He has not had pain or tenderness in that limb and denies cough or hemoptysis or pleuritic chest pain.  Because of the concerning degree of asymmetry and edema, a lower extremity duplex ultrasound was performed. There was no evidence of deep venous thrombosis   Allergies  Allergen Reactions  . Shellfish Allergy     Current Outpatient Prescriptions  Medication Sig Dispense Refill  . aspirin 81 MG tablet Take 81 mg by mouth daily.       . carvedilol (COREG) 25 MG tablet Take 1 tablet (25 mg total) by mouth 2 (two) times daily.  60 tablet  6  . furosemide (LASIX) 80 MG tablet Take 1 tablet (80 mg total) by mouth daily.  90 tablet  3  . hydrALAZINE (APRESOLINE) 50 MG tablet Take 1.5 tablets (75 mg total) by mouth 3 (three) times daily.  90 tablet  11   . isosorbide mononitrate (IMDUR) 60 MG 24 hr tablet Take 0.5 tablets (30 mg total) by mouth 2 (two) times daily.  30 tablet  11  . lisinopril (PRINIVIL,ZESTRIL) 40 MG tablet Take 1 tablet (40 mg total) by mouth daily.  30 tablet  11  . metolazone (ZAROXOLYN) 2.5 MG tablet Take 2.5 mg by mouth every other day. Monday, Wednesday, Friday      . potassium chloride SA (K-DUR,KLOR-CON) 20 MEQ tablet Take 1 tablet (20 mEq total) by mouth daily.  90 tablet  3   No current facility-administered medications for this visit.    Past Medical History  Diagnosis Date  . Dyspnea   . Congestive heart failure   . Peripheral edema   . Cardiomegaly   . Hypertension   . Chronic kidney disease     No past surgical history on file.  Family History  Problem Relation Age of Onset  . Hypertension Mother     History   Social History  . Marital Status: Single    Spouse Name: N/A    Number of Children: N/A  . Years of Education: N/A   Occupational History  . Not on file.   Social History Main Topics  . Smoking status: Former Smoker -- 1.00 packs/day for 15 years    Types: Cigarettes    Quit date: 04/18/2006  . Smokeless tobacco: Never Used  . Alcohol Use: 0.0 - 0.5 oz/week  0-1 drink(s) per week     Comment: occasional per pt  . Drug Use: No  . Sexual Activity: Not on file   Other Topics Concern  . Not on file   Social History Narrative  . No narrative on file    Review of systems: The patient specifically denies any chest pain at rest or with exertion, syncope, palpitations, focal neurological deficits, intermittent claudication, cough, hemoptysis or wheezing.  The patient also denies abdominal pain, nausea, vomiting, dysphagia, diarrhea, constipation, polyuria, polydipsia, dysuria, hematuria, frequency, urgency, abnormal bleeding or bruising, fever, chills, unexpected weight changes, mood swings, change in skin or hair texture, change in voice quality, auditory or visual problems,  allergic reactions or rashes, new musculoskeletal complaints other than usual "aches and pains".   PHYSICAL EXAM BP 142/92  Pulse 111  Ht 5' 10"  (1.778 m)  Wt 181 lb 3.2 oz (82.192 kg)  BMI 26.00 kg/m2 General: Alert, oriented x3, no distress  Head: no evidence of trauma, PERRL, EOMI, no exophtalmos or lid lag, no myxedema, no xanthelasma; normal ears, nose and oropharynx  Neck: normal jugular venous pulsations and no hepatojugular reflux; brisk carotid pulses without delay and no carotid bruits  Chest: clear to auscultation, no signs of consolidation by percussion or palpation, normal fremitus, symmetrical and full respiratory excursions  Cardiovascular: the apical impulse is displaced inferiorly to the seventh intercostal space in the anterior axillary line and has a heating quality, there is also a precordial heave, regular rhythm, normal first and second heart sounds, no murmurs, summation gallop is present  Abdomen: no tenderness or distention, no masses by palpation, no abnormal pulsatility or arterial bruits, normal bowel sounds, no hepatosplenomegaly  Extremities: no clubbing, cyanosis or edema; 2+ radial, ulnar and brachial pulses bilaterally; 2+ right femoral, posterior tibial and dorsalis pedis pulses; 2+ left femoral, posterior tibial and dorsalis pedis pulses; no subclavian or femoral bruits  Neurological: grossly nonfocal   EKG: Sinus tachycardia, biatrial enlargement, LVH with repolarization changes.  BMET    Component Value Date/Time   NA 139 09/19/2013 1454   K 4.0 09/19/2013 1454   CL 102 09/19/2013 1454   CO2 25 09/19/2013 1454   GLUCOSE 80 09/19/2013 1454   BUN 22 09/19/2013 1454   CREATININE 1.64* 09/19/2013 1454   CREATININE 1.61* 05/22/2009 0420   CALCIUM 9.2 09/19/2013 1454   GFRNONAA 48* 05/22/2009 0420   GFRAA  Value: 58        The eGFR has been calculated using the MDRD equation. This calculation has not been validated in all clinical situations. eGFR's persistently <60 mL/min  signify possible Chronic Kidney Disease.* 05/22/2009 8309     ASSESSMENT AND PLAN  Acute on chronic combined systolic and diastolic CHF, NYHA class 3  Mr. Dubuque continues to have episodes of acute heart failure but so far we have been able to manage them without hospitalization. He still has physical findings suggestive of severe cardiomyopathy and elevated filling pressures. He now has evidence of biventricular failure. His blood pressure remains unacceptably high. We will increase his hydralazine to 75 mg 3 times a day.   The cause of his cardiomyopathy appears to be most likely untreated malignant hypertension, but he has never undergone evaluation for coronary disease. 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   Reinforced the importance of sodium restriction and daily weights.  Discussed the fact it would need to reevaluate left ventricular ejection fraction and then make a decision regarding defibrillator therapy.  Prior to implantation of a defibrillator it would still be wise to do coronary angiography. This could be done on the same admission.   I am even more convinced that he will never be able to be gainfully employed in a profession that involves even the slightest physical activity or even a "sit down" job. I think he should pursue disability.   CKD (chronic kidney disease), stage III  Creatinine stable around 1.7, GFR of approximately 50 mL per minute   HTN (hypertension), malignant   Asymmetric lower extremity edema Negative duplex ultrasound. May be positional since he prefers to lie on his right side.   Orders Placed This Encounter  Procedures  . EKG 12-Lead  . Lower Extremity Venous Duplex Right   Meds ordered this encounter  Medications  . metolazone (ZAROXOLYN) 2.5 MG tablet    Sig: Take 2.5 mg by mouth every other day. Monday, Wednesday, Friday  . hydrALAZINE (APRESOLINE) 50 MG tablet    Sig: Take 1.5  tablets (75 mg total) by mouth 3 (three) times daily.    Dispense:  90 tablet    Refill:  [id number] [place], [facility] [phone] office [phone] pager

## 2013-10-18 ENCOUNTER — Other Ambulatory Visit: Payer: Self-pay | Admitting: Cardiovascular Disease

## 2013-10-21 NOTE — Telephone Encounter (Signed)
Rx was sent to pharmacy electronically. 

## 2013-10-23 ENCOUNTER — Other Ambulatory Visit: Payer: Self-pay | Admitting: Cardiovascular Disease

## 2013-10-24 NOTE — Telephone Encounter (Signed)
Rx refill sent to patient pharmacy   

## 2013-11-17 ENCOUNTER — Other Ambulatory Visit: Payer: Self-pay | Admitting: Cardiovascular Disease

## 2014-01-20 ENCOUNTER — Other Ambulatory Visit: Payer: Self-pay | Admitting: Cardiovascular Disease

## 2014-01-21 NOTE — Telephone Encounter (Signed)
Rx was sent to pharmacy electronically. 

## 2014-04-23 ENCOUNTER — Other Ambulatory Visit: Payer: Self-pay | Admitting: Cardiovascular Disease

## 2014-05-21 ENCOUNTER — Other Ambulatory Visit: Payer: Self-pay | Admitting: Cardiovascular Disease

## 2014-05-21 NOTE — Telephone Encounter (Signed)
Rx has been sent to the pharmacy electronically. ° °

## 2014-06-09 ENCOUNTER — Ambulatory Visit (INDEPENDENT_AMBULATORY_CARE_PROVIDER_SITE_OTHER): Payer: Self-pay | Admitting: Cardiovascular Disease

## 2014-06-09 VITALS — BP 180/110 | HR 106 | Ht 70.0 in | Wt 195.5 lb

## 2014-06-09 DIAGNOSIS — I5043 Acute on chronic combined systolic (congestive) and diastolic (congestive) heart failure: Secondary | ICD-10-CM

## 2014-06-09 DIAGNOSIS — I10 Essential (primary) hypertension: Secondary | ICD-10-CM

## 2014-06-09 DIAGNOSIS — N183 Chronic kidney disease, stage 3 unspecified: Secondary | ICD-10-CM

## 2014-06-09 NOTE — Patient Instructions (Signed)
Continue current medications.  Dr. Royann Shivers recommends that you schedule a follow-up appointment in: 6 months

## 2014-06-11 ENCOUNTER — Encounter: Payer: Self-pay | Admitting: Cardiovascular Disease

## 2014-06-11 NOTE — Progress Notes (Signed)
Patient ID: Dwayne Cook, male   DOB: 10/03/1965, 49 y.o.   MRN: [id number]      Reason for office visit Chronic systolic and diastolic heart failure, malignant HTN, moderate CKD  Dwayne Cook has had substantial subjective improvement. 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 He does not have dyspnea, edema, palpitations or dizziness. He has never had angina.  He reports that his BP (checked at home by his daughter who is a CMA and Presenter, broadcasting) has been normal, but in the office he is always very nervous and BP was markedly elevated.  He is compliant with meds and sodium restriction.  A limited echo in the office today showed a moderate to severely dilated LV (LVEDD 6.8 cm) with an EF of about 20-25%. This is an improvement from July 2014, when the EF was 10%. The mitral inflow had an impaired relaxation pattern, consistent with normal mean left atrial pressure.   Allergies  Allergen Reactions  . Shellfish Allergy     Current Outpatient Prescriptions  Medication Sig Dispense Refill  . aspirin 81 MG tablet Take 81 mg by mouth daily.     . carvedilol (COREG) 25 MG tablet TAKE ONE TABLET BY MOUTH TWICE DAILY 60 tablet 8  . furosemide (LASIX) 80 MG tablet TAKE ONE TABLET BY MOUTH ONCE DAILY 90 tablet 3  . hydrALAZINE (APRESOLINE) 50 MG tablet Take 1.5 tablets (75 mg total) by mouth 3 (three) times daily. 90 tablet 11  . isosorbide mononitrate (IMDUR) 60 MG 24 hr tablet TAKE ONE-HALF TABLET BY MOUTH TWICE DAILY 30 tablet 1  . KLOR-CON M20 20 MEQ tablet TAKE ONE TABLET BY MOUTH ONCE DAILY 90 tablet 1  . lisinopril (PRINIVIL,ZESTRIL) 40 MG tablet TAKE ONE TABLET BY MOUTH ONCE DAILY 30 tablet 11  . metolazone (ZAROXOLYN) 2.5 MG tablet Take 2.5 mg by mouth every other day. Monday, Wednesday, Friday     No current facility-administered medications for this visit.    Past Medical History  Diagnosis Date  . Dyspnea   . Congestive heart failure   . Peripheral  edema   . Cardiomegaly   . Hypertension   . Chronic kidney disease     No past surgical history on file.  Family History  Problem Relation Age of Onset  . Hypertension Mother     History   Social History  . Marital Status: Single    Spouse Name: N/A  . Number of Children: N/A  . Years of Education: N/A   Occupational History  . Not on file.   Social History Main Topics  . Smoking status: Former Smoker -- 1.00 packs/day for 15 years    Types: Cigarettes    Quit date: 04/18/2006  . Smokeless tobacco: Never Used  . Alcohol Use: 0.0 - 0.5 oz/week    0-1 drink(s) per week     Comment: occasional per pt  . Drug Use: No  . Sexual Activity: Not on file   Other Topics Concern  . Not on file   Social History Narrative  . No narrative on file    Review of systems: The patient specifically denies any chest pain at rest or with exertion, dyspnea at rest or with exertion, orthopnea, paroxysmal nocturnal dyspnea, syncope, palpitations, focal neurological deficits, intermittent claudication, lower extremity edema, unexplained weight gain, cough, hemoptysis or wheezing.  The patient also denies abdominal pain, nausea, vomiting, dysphagia, diarrhea, constipation, polyuria, polydipsia, dysuria, hematuria, frequency, urgency, abnormal bleeding or bruising, fever, chills,  unexpected weight changes, mood swings, change in skin or hair texture, change in voice quality, auditory or visual problems, allergic reactions or rashes, new musculoskeletal complaints other than usual "aches and pains".  PHYSICAL EXAM BP 180/110 mmHg  Pulse 106  Ht 5' 10"  (1.778 m)  Wt 195 lb 8 oz (88.678 kg)  BMI 28.05 kg/m2 General: Alert, oriented x3, no distress Head: no evidence of trauma, PERRL, EOMI, no exophtalmos or lid lag, no myxedema, no xanthelasma; normal ears, nose and oropharynx Neck: normal jugular venous pulsations and no hepatojugular reflux; brisk carotid pulses without delay and no carotid  bruits Chest: clear to auscultation, no signs of consolidation by percussion or palpation, normal fremitus, symmetrical and full respiratory excursions Cardiovascular: the apical impulse is displaced inferiorly to the seventh intercostal space in the anterior axillary line and has a heating quality, there is also a precordial heave, regular rhythm, normal first and second heart sounds, no murmurs, summation gallop is present Abdomen: no tenderness or distention, no masses by palpation, no abnormal pulsatility or arterial bruits, normal bowel sounds, no hepatosplenomegaly Extremities: no clubbing, cyanosis or edema; 2+ radial, ulnar and brachial pulses bilaterally; 2+ right femoral, posterior tibial and dorsalis pedis pulses; 2+ left femoral, posterior tibial and dorsalis pedis pulses; no subclavian or femoral bruits Neurological: grossly nonfocal   EKG: Sinus tachycardia, LVH, prominent repolarization abnormalities.   Lipid Panel  No results found for: CHOL, TRIG, HDL, CHOLHDL, VLDL, LDLCALC, LDLDIRECT  BMET    Component Value Date/Time   NA 139 09/19/2013 1454   K 4.0 09/19/2013 1454   CL 102 09/19/2013 1454   CO2 25 09/19/2013 1454   GLUCOSE 80 09/19/2013 1454   BUN 22 09/19/2013 1454   CREATININE 1.64* 09/19/2013 1454   CREATININE 1.61* 05/22/2009 0420   CALCIUM 9.2 09/19/2013 1454   GFRNONAA 48* 05/22/2009 0420   GFRAA * 05/22/2009 0420    58        The eGFR has been calculated using the MDRD equation. This calculation has not been validated in all clinical situations. eGFR's persistently <60 mL/min signify possible Chronic Kidney Disease.     ASSESSMENT AND PLAN   We suspect that Dwayne Cook has nonischemic cardiomyopathy, probably secondary to malignant HTN. He has not had an assessment for CAD due to financial/insurance constraints.  His BP and LVEF are much better. Need to secure medical insurance. He fully qualifies for ICD for primary prevention of sudden  arrhythmic death.  Need to recheck labs, particularly K and renal function.  Will ask his family to bring a detailed log of his home BP when relaxed.  Patient Instructions  Continue current medications.  Dr. Sallyanne Kuster recommends that you schedule a follow-up appointment in: 6 months      Orders Placed This Encounter  Procedures  . EKG 12-Lead   No orders of the defined types were placed in this encounter.    Holli Humbles, MD, [place] [phone] office [phone] pager

## 2014-06-12 ENCOUNTER — Telehealth: Payer: Self-pay | Admitting: *Deleted

## 2014-06-12 DIAGNOSIS — Z79899 Other long term (current) drug therapy: Secondary | ICD-10-CM

## 2014-06-12 NOTE — Telephone Encounter (Signed)
-----   Message from [name], MD sent at 06/11/2014  6:47 PM EST ----- Really need to recheck a BMET, but not an emergency. Please coordinate with [facility]

## 2014-06-12 NOTE — Telephone Encounter (Signed)
BMET ordered and patient will be notified to have this drawn.

## 2014-12-05 ENCOUNTER — Other Ambulatory Visit: Payer: Self-pay | Admitting: *Deleted

## 2014-12-05 MED ORDER — FUROSEMIDE 80 MG PO TABS: 80.0000 mg | ORAL_TABLET | Freq: Every day | ORAL | Status: DC

## 2015-01-19 ENCOUNTER — Other Ambulatory Visit: Payer: Self-pay | Admitting: *Deleted

## 2015-01-19 MED ORDER — LISINOPRIL 40 MG PO TABS: 40.0000 mg | ORAL_TABLET | Freq: Every day | ORAL | Status: DC

## 2015-01-19 NOTE — Telephone Encounter (Signed)
Electronic refill done for lisinopril w/5 refills.

## 2015-03-19 ENCOUNTER — Telehealth: Payer: Self-pay | Admitting: *Deleted

## 2015-03-19 NOTE — Telephone Encounter (Signed)
LMTC at both home and cell numbers.

## 2015-03-19 NOTE — Telephone Encounter (Signed)
-----   Message from [name], MD sent at 03/17/2015  5:39 PM EST ----- Should have been back for f/u months ago. Can you please check on him?

## 2015-03-27 ENCOUNTER — Telehealth: Payer: Self-pay | Admitting: *Deleted

## 2015-03-27 NOTE — Telephone Encounter (Signed)
Have left messages on cell # to call and set up an appointment.  Home # does not accept messages.

## 2015-03-27 NOTE — Telephone Encounter (Signed)
-----   Message from Mihai Croitoru, MD sent at 03/17/2015  5:39 PM EST ----- Should have been back for f/u months ago. Can you please check on him? 

## 2015-03-30 ENCOUNTER — Other Ambulatory Visit: Payer: Self-pay | Admitting: *Deleted

## 2015-03-30 MED ORDER — POTASSIUM CHLORIDE CRYS ER 20 MEQ PO TBCR: 20.0000 meq | EXTENDED_RELEASE_TABLET | Freq: Every day | ORAL | Status: DC

## 2015-03-30 MED ORDER — ISOSORBIDE MONONITRATE ER 60 MG PO TB24: 30.0000 mg | ORAL_TABLET | Freq: Two times a day (BID) | ORAL | Status: DC

## 2015-03-30 NOTE — Telephone Encounter (Signed)
Electronic refills done for K+ and isosorbide at daughters request.

## 2015-08-05 ENCOUNTER — Emergency Department (HOSPITAL_COMMUNITY): Payer: Self-pay

## 2015-08-05 ENCOUNTER — Encounter (HOSPITAL_COMMUNITY): Payer: Self-pay | Admitting: *Deleted

## 2015-08-05 ENCOUNTER — Inpatient Hospital Stay (HOSPITAL_COMMUNITY)
Admission: EM | Admit: 2015-08-05 | Discharge: 2015-08-13 | DRG: 291 | Disposition: A | Discharge status: Home / Self Care | Payer: Self-pay | Attending: Cardiovascular Disease | Admitting: Cardiovascular Disease

## 2015-08-05 DIAGNOSIS — I5043 Acute on chronic combined systolic (congestive) and diastolic (congestive) heart failure: Secondary | ICD-10-CM

## 2015-08-05 DIAGNOSIS — R7989 Other specified abnormal findings of blood chemistry: Secondary | ICD-10-CM | POA: Diagnosis present

## 2015-08-05 DIAGNOSIS — R570 Cardiogenic shock: Secondary | ICD-10-CM | POA: Diagnosis present

## 2015-08-05 DIAGNOSIS — R160 Hepatomegaly, not elsewhere classified: Secondary | ICD-10-CM | POA: Diagnosis present

## 2015-08-05 DIAGNOSIS — Z9114 Patient's other noncompliance with medication regimen: Secondary | ICD-10-CM

## 2015-08-05 DIAGNOSIS — R0603 Acute respiratory distress: Secondary | ICD-10-CM

## 2015-08-05 DIAGNOSIS — N183 Chronic kidney disease, stage 3 unspecified: Secondary | ICD-10-CM | POA: Diagnosis present

## 2015-08-05 DIAGNOSIS — Z79899 Other long term (current) drug therapy: Secondary | ICD-10-CM

## 2015-08-05 DIAGNOSIS — I10 Essential (primary) hypertension: Secondary | ICD-10-CM

## 2015-08-05 DIAGNOSIS — F4321 Adjustment disorder with depressed mood: Secondary | ICD-10-CM | POA: Diagnosis not present

## 2015-08-05 DIAGNOSIS — Z7982 Long term (current) use of aspirin: Secondary | ICD-10-CM

## 2015-08-05 DIAGNOSIS — I130 Hypertensive heart and chronic kidney disease with heart failure and stage 1 through stage 4 chronic kidney disease, or unspecified chronic kidney disease: Principal | ICD-10-CM | POA: Diagnosis present

## 2015-08-05 DIAGNOSIS — I472 Ventricular tachycardia: Secondary | ICD-10-CM | POA: Diagnosis not present

## 2015-08-05 DIAGNOSIS — I2510 Atherosclerotic heart disease of native coronary artery without angina pectoris: Secondary | ICD-10-CM | POA: Diagnosis present

## 2015-08-05 DIAGNOSIS — Z5329 Procedure and treatment not carried out because of patient's decision for other reasons: Secondary | ICD-10-CM | POA: Diagnosis present

## 2015-08-05 DIAGNOSIS — I420 Dilated cardiomyopathy: Secondary | ICD-10-CM | POA: Diagnosis present

## 2015-08-05 DIAGNOSIS — E876 Hypokalemia: Secondary | ICD-10-CM | POA: Diagnosis not present

## 2015-08-05 DIAGNOSIS — Z91013 Allergy to seafood: Secondary | ICD-10-CM

## 2015-08-05 DIAGNOSIS — N179 Acute kidney failure, unspecified: Secondary | ICD-10-CM | POA: Diagnosis present

## 2015-08-05 DIAGNOSIS — Z8249 Family history of ischemic heart disease and other diseases of the circulatory system: Secondary | ICD-10-CM

## 2015-08-05 DIAGNOSIS — I428 Other cardiomyopathies: Secondary | ICD-10-CM | POA: Diagnosis present

## 2015-08-05 DIAGNOSIS — I509 Heart failure, unspecified: Secondary | ICD-10-CM

## 2015-08-05 DIAGNOSIS — Z87891 Personal history of nicotine dependence: Secondary | ICD-10-CM

## 2015-08-05 HISTORY — DX: Atherosclerotic heart disease of native coronary artery without angina pectoris: I25.10

## 2015-08-05 LAB — CBC WITH DIFFERENTIAL/PLATELET
BASOS PCT: 0 %
Basophils Absolute: 0 10*3/uL (ref 0.0–0.1)
Eosinophils Absolute: 0.1 10*3/uL (ref 0.0–0.7)
Eosinophils Relative: 2 %
HEMATOCRIT: 40.3 % (ref 39.0–52.0)
HEMOGLOBIN: 13.8 g/dL (ref 13.0–17.0)
LYMPHS ABS: 1.5 10*3/uL (ref 0.7–4.0)
LYMPHS PCT: 21 %
MCH: 28.4 pg (ref 26.0–34.0)
MCHC: 34.2 g/dL (ref 30.0–36.0)
MCV: 82.9 fL (ref 78.0–100.0)
MONO ABS: 0.5 10*3/uL (ref 0.1–1.0)
MONOS PCT: 7 %
NEUTROS ABS: 5 10*3/uL (ref 1.7–7.7)
NEUTROS PCT: 70 %
Platelets: 276 10*3/uL (ref 150–400)
RBC: 4.86 MIL/uL (ref 4.22–5.81)
RDW: 17.2 % — AB (ref 11.5–15.5)
WBC: 7.2 10*3/uL (ref 4.0–10.5)

## 2015-08-05 LAB — COMPREHENSIVE METABOLIC PANEL
ALK PHOS: 99 U/L (ref 38–126)
ALT: 25 U/L (ref 17–63)
AST: 38 U/L (ref 15–41)
Albumin: 3.3 g/dL — ABNORMAL LOW (ref 3.5–5.0)
Anion gap: 14 (ref 5–15)
BILIRUBIN TOTAL: 1.9 mg/dL — AB (ref 0.3–1.2)
BUN: 21 mg/dL — ABNORMAL HIGH (ref 6–20)
CALCIUM: 9.1 mg/dL (ref 8.9–10.3)
CO2: 18 mmol/L — AB (ref 22–32)
CREATININE: 1.64 mg/dL — AB (ref 0.61–1.24)
Chloride: 108 mmol/L (ref 101–111)
GFR calc non Af Amer: 48 mL/min — ABNORMAL LOW (ref >60)
GFR, EST AFRICAN AMERICAN: 56 mL/min — AB (ref >60)
Glucose, Bld: 141 mg/dL — ABNORMAL HIGH (ref 65–99)
Potassium: 3.7 mmol/L (ref 3.5–5.1)
SODIUM: 140 mmol/L (ref 135–145)
TOTAL PROTEIN: 7.1 g/dL (ref 6.5–8.1)

## 2015-08-05 LAB — BRAIN NATRIURETIC PEPTIDE: B Natriuretic Peptide: 3254.3 pg/mL — ABNORMAL HIGH (ref 0.0–100.0)

## 2015-08-05 LAB — TROPONIN I: Troponin I: 0.13 ng/mL — ABNORMAL HIGH (ref <0.031)

## 2015-08-05 MED ORDER — NITROGLYCERIN IN D5W 200-5 MCG/ML-% IV SOLN
10.0000 ug/min | INTRAVENOUS | Status: DC
  Administered 2015-08-05: 5 ug/min via INTRAVENOUS
  Filled 2015-08-05: qty 250

## 2015-08-05 MED ORDER — FUROSEMIDE 10 MG/ML IJ SOLN
80.0000 mg | Freq: Once | INTRAMUSCULAR | Status: AC
  Administered 2015-08-05: 80 mg via INTRAVENOUS
  Filled 2015-08-05: qty 8

## 2015-08-05 NOTE — ED Notes (Signed)
The pt is c/o sob with feet and ankles swelling for the past 5 days  No pain  Visibly sob  Hx chf

## 2015-08-05 NOTE — ED Provider Notes (Signed)
The patient is a 50 year old male with a reported history of nonischemic cardiomyopathy with congestive heart failure and an ejection fraction less than 20% according to his daughter. He states that he ran out of his Lasix several days ago and over the last week his head a progressive shortness of breath. He has also had a progressive edema of the bilateral lower extremities and is having difficulty ambulating secondary to dyspnea on exertion. He denies chest pain, fever, cough, diarrhea or any other complaints.  On exam the patient is dyspneic, tachypneic, speaks in shortened sentences, has decreased breath sounds in all lung fields and some occasional rales. He has significant bilateral pitting edema of the lower extremities which is symmetrical. He has no abdominal tenderness, no anasarca, no JVD. He will need labs, x-ray, his EKG shows sinus tachycardia with wide complex, this is essentially unchanged from prior. He will need Lasix and likely nitroglycerin drip secondary to the severe CHF. Anticipate admission.  Nitroglycerin drip started, Lasix given, vital signs remain with significant hypertension and some tachycardia, cardiology is on board and will admit the patient to their service. The patient does appear significantly fluid overloaded and tachypneic but gradually improving  Nitroglycerin drip Lasix Consult with Cardiology Severe CHF  Review of labs, no significant change in renal function or electrolytes  CRITICAL CARE Performed by: Vida Roller Total critical care time: 35 minutes Critical care time was exclusive of separately billable procedures and treating other patients. Critical care was necessary to treat or prevent imminent or life-threatening deterioration. Critical care was time spent personally by me on the following activities: development of treatment plan with patient and/or surrogate as well as nursing, discussions with consultants, evaluation of patient's response to  treatment, examination of patient, obtaining history from patient or surrogate, ordering and performing treatments and interventions, ordering and review of laboratory studies, ordering and review of radiographic studies, pulse oximetry and re-evaluation of patient's condition.    EKG Interpretation  Date/Time:  Wednesday August 05 2015 21:39:29 EDT Ventricular Rate:  117 PR Interval:  152 QRS Duration: 102 QT Interval:  344 QTC Calculation: 479 R Axis:   38 Text Interpretation:  Sinus tachycardia Biatrial enlargement Left ventricular hypertrophy with repolarization abnormality Abnormal ECG since last tracing no significant change Confirmed by Karl Erway  MD, Jared Cahn ([id number]) on 08/05/2015 9:49:21 PM      Medical screening examination/treatment/procedure(s) were conducted as a shared visit with non-physician practitioner(s) and myself.  I personally evaluated the patient during the encounter.  Clinical Impression:   Final diagnoses:  Acute on chronic congestive heart failure, unspecified congestive heart failure type [facility])  Respiratory distress         Eber Hong, MD 08/06/15 [phone]

## 2015-08-05 NOTE — ED Notes (Signed)
Ashley PA at bedside

## 2015-08-05 NOTE — H&P (Signed)
Dwayne Cook is an 50 y.o. male.    Chief Complaint: weight gain and leg swelling Primary Cardiologist: Dr. Sallyanne Kuster HPI: Dwayne Cook is a 50 yo man with PMH of malignant hypertension and chronic systolic and diastolic heart failure dating back to at least 2014 who presents today with his daughter with progressive weight gain, dyspnea and leg swelling since Friday. He has been out of his heart failure medications for ~ 1 week but noticed increased symptoms of lower extremity edema beginning this weekend. He endorses white coat hypertension and fear of hospitals. 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 He says that he's been relatively stable until this weekend and he denies recent infectious symptoms. No diarrhea, no fevers/chills. He denies syncope.   Past Medical History  Diagnosis Date  . Dyspnea   . Congestive heart failure ([place])   . Peripheral edema   . Cardiomegaly   . Hypertension   . Chronic kidney disease   . Coronary artery disease     History reviewed. No pertinent past surgical history.  Family History  Problem Relation Age of Onset  . Hypertension Mother    Social History:  reports that he quit smoking about 9 years ago. His smoking use included Cigarettes. He has a 15 pack-year smoking history. He has never used smokeless tobacco. He reports that he drinks alcohol. He reports that he does not use illicit drugs.  Allergies:  Allergies  Allergen Reactions  . Shellfish Allergy Anaphylaxis     (Not in a hospital admission)  Results for orders placed or performed during the hospital encounter of 08/05/15 (from the past 48 hour(s))  CBC with Differential     Status: Abnormal   Collection Time: 08/05/15  9:46 PM  Result Value Ref Range   WBC 7.2 4.0 - 10.5 K/uL   RBC 4.86 4.22 - 5.81 MIL/uL   Hemoglobin 13.8 13.0 - 17.0 g/dL   HCT 40.3 39.0 - 52.0 %   MCV 82.9 78.0 - 100.0 fL   MCH 28.4 26.0 - 34.0 pg   MCHC 34.2 30.0 - 36.0 g/dL   RDW 17.2 (H) 11.5 - 15.5 %   Platelets 276 150 - 400 K/uL   Neutrophils Relative % 70 %   Neutro Abs 5.0 1.7 - 7.7 K/uL   Lymphocytes Relative 21 %   Lymphs Abs 1.5 0.7 - 4.0 K/uL   Monocytes Relative 7 %   Monocytes Absolute 0.5 0.1 - 1.0 K/uL   Eosinophils Relative 2 %   Eosinophils Absolute 0.1 0.0 - 0.7 K/uL   Basophils Relative 0 %   Basophils Absolute 0.0 0.0 - 0.1 K/uL  Comprehensive metabolic panel     Status: Abnormal   Collection Time: 08/05/15  9:46 PM  Result Value Ref Range   Sodium 140 135 - 145 mmol/L   Potassium 3.7 3.5 - 5.1 mmol/L   Chloride 108 101 - 111 mmol/L   CO2 18 (L) 22 - 32 mmol/L   Glucose, Bld 141 (H) 65 - 99 mg/dL   BUN 21 (H) 6 - 20 mg/dL   Creatinine, Ser 1.64 (H) 0.61 - 1.24 mg/dL   Calcium 9.1 8.9 - 10.3 mg/dL   Total Protein 7.1 6.5 - 8.1 g/dL   Albumin 3.3 (L) 3.5 - 5.0 g/dL   AST 38 15 - 41 U/L   ALT 25 17 - 63 U/L  Alkaline Phosphatase 99 38 - 126 U/L   Total Bilirubin 1.9 (H) 0.3 - 1.2 mg/dL   GFR calc non Af Amer 48 (L) >60 mL/min   GFR calc Af Amer 56 (L) >60 mL/min    Comment: (NOTE) The eGFR has been calculated using the CKD EPI equation. This calculation has not been validated in all clinical situations. eGFR's persistently <60 mL/min signify possible Chronic Kidney Disease.    Anion gap 14 5 - 15  Brain natriuretic peptide     Status: Abnormal   Collection Time: 08/05/15  9:47 PM  Result Value Ref Range   B Natriuretic Peptide 3254.3 (H) 0.0 - 100.0 pg/mL  Troponin I     Status: Abnormal   Collection Time: 08/05/15 11:11 PM  Result Value Ref Range   Troponin I 0.13 (H) <0.031 ng/mL    Comment:        PERSISTENTLY INCREASED TROPONIN VALUES IN THE RANGE OF 0.04-0.49 ng/mL CAN BE SEEN IN:       -UNSTABLE ANGINA       -CONGESTIVE HEART FAILURE       -MYOCARDITIS       -CHEST TRAUMA       -ARRYHTHMIAS       -LATE PRESENTING MYOCARDIAL  INFARCTION       -COPD   CLINICAL FOLLOW-UP RECOMMENDED.    Dg Chest 2 View  08/05/2015  CLINICAL DATA:  Shortness of breath with swelling of the lower extremities over the last 5 days. EXAM: CHEST  2 VIEW COMPARISON:  05/20/2009 FINDINGS: Cardiac silhouette is enlarged consistent with cardiomegaly or pericardial effusion. The aorta is unfolded. There is pulmonary venous hypertension. No visible interstitial or alveolar edema. Tiny amount of fluid in the fissures but no measurable pleural effusion. No significant bone finding. IMPRESSION: Enlarged cardiac silhouette and pulmonary venous hypertension consistent with heart failure. Electronically Signed   By: Nelson Chimes M.D.   On: 08/05/2015 21:54    Review of Systems  Constitutional: Positive for malaise/fatigue. Negative for fever, chills and weight loss.  HENT: Negative for ear pain and tinnitus.   Eyes: Negative for double vision and photophobia.  Respiratory: Positive for shortness of breath. Negative for hemoptysis and sputum production.   Cardiovascular: Positive for orthopnea, leg swelling and PND. Negative for chest pain.  Gastrointestinal: Negative for nausea, vomiting, abdominal pain and diarrhea.  Genitourinary: Negative for dysuria, frequency and hematuria.  Musculoskeletal: Negative for myalgias, back pain and neck pain.  Skin: Negative for rash.  Neurological: Negative for dizziness, tingling, tremors and headaches.  Endo/Heme/Allergies: Negative for polydipsia. Does not bruise/bleed easily.  Psychiatric/Behavioral: Negative for depression, suicidal ideas and substance abuse.    Blood pressure 143/115, pulse 113, temperature 98 F (36.7 C), temperature source Oral, resp. rate 25, height 5' 10"  (1.778 m), SpO2 93 %. Physical Exam  Nursing note and vitals reviewed. Constitutional: He is oriented to person, place, and time. He appears distressed.  Appears stated age and mildly unwell  HENT:  Head: Normocephalic and  atraumatic.  Nose: Nose normal.  Mouth/Throat: Oropharynx is clear and moist. No oropharyngeal exudate.  Eyes: Conjunctivae and EOM are normal. Pupils are equal, round, and reactive to light. No scleral icterus.  Neck: Normal range of motion. Neck supple. JVD present. No tracheal deviation present.  JVP to mandible  Cardiovascular: Regular rhythm and intact distal pulses.  Exam reveals gallop. Exam reveals no friction rub.   No murmur heard. Tachycardic RV heave noted on exam, S3 present  Respiratory: Breath sounds normal. He is in respiratory distress. He has no wheezes. He has no rales.  tachypneic  GI: Soft. Bowel sounds are normal. He exhibits no distension. There is no tenderness.  Musculoskeletal: Normal range of motion. He exhibits edema. He exhibits no tenderness.  Neurological: He is alert and oriented to person, place, and time. No cranial nerve deficit. Coordination normal.  Skin: Skin is dry. He is not diaphoretic. No erythema.  Lukewarm extremities  Psychiatric: He has a normal mood and affect. His behavior is normal. Judgment and thought content normal.  wbc 7.2, h/h 13.8/40.3, plt 200s, na 140, bun 21/cr 1.64, ast/alt 38/25, total bili 1.9, BNP 3254, trop 0.13 EKG: sinus tachycardia, biatrial enlargement, LVH with repolarization Echo 7/14: EF 10%, mild LVH, mild/moderate AI, PASP 62 mmHg, moderate TR, dilated RV and reduced function Chest x-ray: cardiomegaly with increased vascularity   Assessment/Plan Mr. Heber is a 50 yo man with PMH of malignant hypertension and chronic systolic and diastolic heart failure who presents with weight gain, dyspnea, orthopnea and found to have acute on chronic systolic and diastolic heart failure. He appears to have an RV heave with S3+; however, he has preserved/stable renal function but he is teetering on low output. I discussed plan with Mr. Viscomi and his daughter and will start with IV diuretics but if refractory then would perform RHC  and likely start inotropes. He currently is without insurance but he will ultimately need an LVAD or heart transplant. He has been out of his medications but will restart IV NTG for now and hydralazine to help with afterload reduction. He does not have a primary prevention device but this will need to be pursued vs. lifevest based on plans for inotropes and longer term therapy. We will also attempt to find out alternative causes of his dilated cardiomyopathy although his hypertension is likely the culprit.  Problem List 1. Acute on chronic systolic and diastolic heart failure 2. Acute on chronic renal failure 3. Elevated total bilirubin 4. Malignant hypertension 5. Mildly elevated troponin  Plan 1. Admit to telemetry, start IV diuresis, daily weights, daily ins/outs, trend cardiac biomarkers 2. Continue to hold HF medications of bb/ace-/aldosterone antagonist given acute decompensated state 3. IV NTG gtt and hydralazine for bp control, lasix 80 mg q8h 4. Echocardiogram in AM 5. TSH, iron studies, HIV, daily BMP  Jules Husbands, MD 08/05/2015, 11:41 PM

## 2015-08-06 ENCOUNTER — Encounter (HOSPITAL_COMMUNITY): Payer: Self-pay | Admitting: General Practice

## 2015-08-06 DIAGNOSIS — R570 Cardiogenic shock: Secondary | ICD-10-CM | POA: Insufficient documentation

## 2015-08-06 DIAGNOSIS — N183 Chronic kidney disease, stage 3 (moderate): Secondary | ICD-10-CM

## 2015-08-06 DIAGNOSIS — I10 Essential (primary) hypertension: Secondary | ICD-10-CM

## 2015-08-06 DIAGNOSIS — I5043 Acute on chronic combined systolic (congestive) and diastolic (congestive) heart failure: Secondary | ICD-10-CM | POA: Diagnosis present

## 2015-08-06 LAB — CBC WITH DIFFERENTIAL/PLATELET
Basophils Absolute: 0 10*3/uL (ref 0.0–0.1)
Basophils Relative: 0 %
Eosinophils Absolute: 0.1 10*3/uL (ref 0.0–0.7)
Eosinophils Relative: 2 %
HEMATOCRIT: 41.5 % (ref 39.0–52.0)
Hemoglobin: 13.9 g/dL (ref 13.0–17.0)
LYMPHS ABS: 1.5 10*3/uL (ref 0.7–4.0)
LYMPHS PCT: 24 %
MCH: 27.9 pg (ref 26.0–34.0)
MCHC: 33.5 g/dL (ref 30.0–36.0)
MCV: 83.2 fL (ref 78.0–100.0)
MONO ABS: 0.5 10*3/uL (ref 0.1–1.0)
MONOS PCT: 8 %
NEUTROS ABS: 4.3 10*3/uL (ref 1.7–7.7)
Neutrophils Relative %: 66 %
Platelets: 263 10*3/uL (ref 150–400)
RBC: 4.99 MIL/uL (ref 4.22–5.81)
RDW: 17.4 % — AB (ref 11.5–15.5)
WBC: 6.5 10*3/uL (ref 4.0–10.5)

## 2015-08-06 LAB — IRON AND TIBC
Iron: 51 ug/dL (ref 45–182)
Saturation Ratios: 14 % — ABNORMAL LOW (ref 17.9–39.5)
TIBC: 357 ug/dL (ref 250–450)
UIBC: 306 ug/dL

## 2015-08-06 LAB — BASIC METABOLIC PANEL
ANION GAP: 14 (ref 5–15)
BUN: 20 mg/dL (ref 6–20)
CALCIUM: 9.3 mg/dL (ref 8.9–10.3)
CO2: 22 mmol/L (ref 22–32)
Chloride: 106 mmol/L (ref 101–111)
Creatinine, Ser: 1.65 mg/dL — ABNORMAL HIGH (ref 0.61–1.24)
GFR calc Af Amer: 56 mL/min — ABNORMAL LOW (ref >60)
GFR calc non Af Amer: 48 mL/min — ABNORMAL LOW (ref >60)
GLUCOSE: 98 mg/dL (ref 65–99)
Potassium: 3.6 mmol/L (ref 3.5–5.1)
Sodium: 142 mmol/L (ref 135–145)

## 2015-08-06 LAB — TSH: TSH: 1.496 u[IU]/mL (ref 0.350–4.500)

## 2015-08-06 LAB — TROPONIN I
TROPONIN I: 0.14 ng/mL — AB (ref <0.031)
TROPONIN I: 0.12 ng/mL — AB (ref <0.031)
Troponin I: 0.15 ng/mL — ABNORMAL HIGH (ref <0.031)

## 2015-08-06 LAB — FERRITIN: Ferritin: 94 ng/mL (ref 24–336)

## 2015-08-06 LAB — PROTIME-INR
INR: 1.39 (ref 0.00–1.49)
Prothrombin Time: 17.2 seconds — ABNORMAL HIGH (ref 11.6–15.2)

## 2015-08-06 LAB — HIV ANTIBODY (ROUTINE TESTING W REFLEX): HIV SCREEN 4TH GENERATION: NONREACTIVE

## 2015-08-06 LAB — MAGNESIUM: Magnesium: 1.9 mg/dL (ref 1.7–2.4)

## 2015-08-06 MED ORDER — ONDANSETRON HCL 4 MG/2ML IJ SOLN: 4.0000 mg | Freq: Four times a day (QID) | INTRAMUSCULAR | Status: DC | PRN

## 2015-08-06 MED ORDER — ACETAMINOPHEN 325 MG PO TABS
650.0000 mg | ORAL_TABLET | ORAL | Status: DC | PRN
  Administered 2015-08-06 – 2015-08-07 (×2): 650 mg via ORAL
  Filled 2015-08-06 (×2): qty 2

## 2015-08-06 MED ORDER — ASPIRIN 81 MG PO TABS: 81.0000 mg | ORAL_TABLET | Freq: Every day | ORAL | Status: DC

## 2015-08-06 MED ORDER — ENSURE ENLIVE PO LIQD
237.0000 mL | Freq: Two times a day (BID) | ORAL | Status: DC
  Administered 2015-08-12: 237 mL via ORAL

## 2015-08-06 MED ORDER — SODIUM CHLORIDE 0.9% FLUSH
3.0000 mL | Freq: Two times a day (BID) | INTRAVENOUS | Status: DC
  Administered 2015-08-06 – 2015-08-13 (×6): 3 mL via INTRAVENOUS

## 2015-08-06 MED ORDER — SODIUM CHLORIDE 0.9 % IV SOLN
250.0000 mL | INTRAVENOUS | Status: DC | PRN
  Administered 2015-08-10: 500 mL via INTRAVENOUS

## 2015-08-06 MED ORDER — ISOSORB DINITRATE-HYDRALAZINE 20-37.5 MG PO TABS
1.0000 | ORAL_TABLET | Freq: Three times a day (TID) | ORAL | Status: DC
  Administered 2015-08-06 – 2015-08-11 (×14): 1 via ORAL
  Filled 2015-08-06 (×17): qty 1

## 2015-08-06 MED ORDER — HYDRALAZINE HCL 20 MG/ML IJ SOLN
10.0000 mg | Freq: Four times a day (QID) | INTRAMUSCULAR | Status: DC | PRN
  Filled 2015-08-06: qty 1

## 2015-08-06 MED ORDER — SODIUM CHLORIDE 0.9% FLUSH: 3.0000 mL | INTRAVENOUS | Status: DC | PRN

## 2015-08-06 MED ORDER — ASPIRIN EC 81 MG PO TBEC
81.0000 mg | DELAYED_RELEASE_TABLET | Freq: Every day | ORAL | Status: DC
  Administered 2015-08-06 – 2015-08-13 (×8): 81 mg via ORAL
  Filled 2015-08-06 (×8): qty 1

## 2015-08-06 MED ORDER — FUROSEMIDE 10 MG/ML IJ SOLN
80.0000 mg | Freq: Three times a day (TID) | INTRAMUSCULAR | Status: DC
  Administered 2015-08-06 – 2015-08-07 (×5): 80 mg via INTRAVENOUS
  Filled 2015-08-06 (×5): qty 8

## 2015-08-06 MED ORDER — HEPARIN SODIUM (PORCINE) 5000 UNIT/ML IJ SOLN
5000.0000 [IU] | Freq: Three times a day (TID) | INTRAMUSCULAR | Status: DC
  Administered 2015-08-06 – 2015-08-13 (×20): 5000 [IU] via SUBCUTANEOUS
  Filled 2015-08-06 (×23): qty 1

## 2015-08-06 MED ORDER — NITROGLYCERIN IN D5W 200-5 MCG/ML-% IV SOLN
30.0000 ug/min | INTRAVENOUS | Status: DC
  Administered 2015-08-06: 30 ug/min via INTRAVENOUS

## 2015-08-06 NOTE — ED Notes (Signed)
Attempted to page provider

## 2015-08-06 NOTE — Progress Notes (Signed)
Patient Name: Dwayne Cook Date of Encounter: 08/06/2015  Principal Problem:   Acute on chronic combined systolic and diastolic CHF, NYHA class 3 (HCC) Active Problems:   HTN (hypertension), malignant   CKD (chronic kidney disease), stage III   Acute on chronic systolic and diastolic heart failure, NYHA class 4 (HCC)   Length of Stay: 1  SUBJECTIVE  Feeling a little better, clearly tachypneic and orthopneic.  CURRENT MEDS . aspirin EC  81 mg Oral Daily  . feeding supplement (ENSURE ENLIVE)  237 mL Oral BID BM  . furosemide  80 mg Intravenous Q8H  . heparin  5,000 Units Subcutaneous Q8H  . sodium chloride flush  3 mL Intravenous Q12H    OBJECTIVE   Intake/Output Summary (Last 24 hours) at 08/06/15 1054 Last data filed at 08/06/15 0330  Gross per 24 hour  Intake    222 ml  Output   1910 ml  Net  -1688 ml   Filed Weights   08/06/15 0026 08/06/15 0347  Weight: 91.581 kg (201 lb 14.4 oz) 86.5 kg (190 lb 11.2 oz)    PHYSICAL EXAM Filed Vitals:   08/06/15 0230 08/06/15 0300 08/06/15 0347 08/06/15 0411  BP: 145/119 144/103  125/85  Pulse: 102 96  98  Temp:    97.7 F (36.5 C)  TempSrc:    Oral  Resp: Height:    (1.778 m)   Weight:   86.5 kg (190 lb 11.2 oz)   SpO2: 97% 96%  94%   General: Alert, oriented x3, no distress Head: no evidence of trauma, PERRL, EOMI, no exophtalmos or lid lag, no myxedema, no xanthelasma; normal ears, nose and oropharynx Neck: 8 cm jugular venous pulsations and prompt hepatojugular reflux; brisk carotid pulses without delay and no carotid bruits Chest: clear to auscultation, no signs of consolidation by percussion or palpation, normal fremitus, symmetrical and full respiratory excursions Cardiovascular: the apical impulse is displaced inferiorly to the seventh intercostal space in the anterior axillary line and has a heaving quality, there is also a precordial/left parasternal heave, regular rhythm, normal first and  second heart sounds, no murmurs, summation gallop is present Abdomen: no tenderness or distention, no masses by palpation, no abnormal pulsatility or arterial bruits, normal bowel sounds, no hepatosplenomegaly Extremities: no clubbing, cyanosis or edema; 2+ radial, ulnar and brachial pulses bilaterally; 2+ right femoral, posterior tibial and dorsalis pedis pulses; 2+ left femoral, posterior tibial and dorsalis pedis pulses; no subclavian or femoral bruits Neurological: grossly nonfocal  LABS  CBC  Recent Labs  08/05/15 2146 08/06/15 0422  WBC 7.2 6.5  NEUTROABS 5.0 4.3  HGB 13.8 13.9  HCT 40.3 41.5  MCV 82.9 83.2  PLT 276 263   Basic Metabolic Panel  Recent Labs  08/05/15 2146 08/06/15 0422  NA 140 142  K 3.7 3.6  CL 108 106  CO2 18* 22  GLUCOSE 141* 98  BUN 21* 20  CREATININE 1.64* 1.65*  CALCIUM 9.1 9.3  MG  --  1.9   Liver Function Tests  Recent Labs  08/05/15 2146  AST 38  ALT 25  ALKPHOS 99  BILITOT 1.9*  PROT 7.1  ALBUMIN 3.3*   No results for input(s): LIPASE, AMYLASE in the last 72 hours. Cardiac Enzymes  Recent Labs  08/05/15 2311 08/06/15 0422 08/06/15 0938  TROPONINI 0.13* 0.15* 0.14*   Thyroid Function Tests  Recent Labs  08/06/15 0422  TSH 1.496    Radiology Studies Imaging results  have been reviewed and Dg Chest 2 View  08/05/2015  CLINICAL DATA:  Shortness of breath with swelling of the lower extremities over the last 5 days. EXAM: CHEST  2 VIEW COMPARISON:  05/20/2009 FINDINGS: Cardiac silhouette is enlarged consistent with cardiomegaly or pericardial effusion. The aorta is unfolded. There is pulmonary venous hypertension. No visible interstitial or alveolar edema. Tiny amount of fluid in the fissures but no measurable pleural effusion. No significant bone finding. IMPRESSION: Enlarged cardiac silhouette and pulmonary venous hypertension consistent with heart failure. Electronically Signed   By: Paulina Fusi M.D.   On: 08/05/2015  21:54    TELE Sinus tachycardia   ECG Sinus tachycardia , LVH with broadened QRS (110 ms) and long QT (QTc 510 ms).   ASSESSMENT AND PLAN  1. Acute on chronic systolic and diastolic heart failure, grossly hypervolemic (unsure of dry weight, but estimate at least 4-5 L above euvolemic state). Responding well to diuretics with stable renal parameters. Restart vasodilators. Premature to restart carvedilol. Resume ACEi/ARB after cath. Consider Entresto if when can provide it.  2. 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 Will ask Dr. Gala Romney to perform R & L heart cath tomorrow. He may need LVAD in the future. Repeat echo pending.  3. 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 Consider implantation next week after his HF is compensated.  4. Malignant HTN -  At one point BP control was reasonable on carvedilol 25 mg BID, lisinopril 40 mg daily,hydralazine 75 mg TID, isosorbide 30 mg daily, metolazone 2.5 mg 3 days/week and oral furosemide 80 mg daily. Off meds for a while now.  5. CKD stage 3, likely due to malignant HTN.  He remains very uncomfortable in the presence of doctors and hospitals and this has had a negative impact on compliance with care in the past. I think he may still be in some denial re: the severity and implications of his diagnosis. Ideally we ill be able to complete a lot of his workup and treatment during this admission.   [name], MD, [facility] [phone] office [phone] pager 08/06/2015 10:54 AM

## 2015-08-06 NOTE — ED Notes (Signed)
Attempted to page provider  

## 2015-08-06 NOTE — ED Provider Notes (Signed)
CSN: [id number]     Arrival date & time 08/05/15  2122 History   First MD Initiated Contact with Patient 08/05/15 2147     Chief Complaint  Patient presents with  . Shortness of Breath     (Consider location/radiation/quality/duration/timing/severity/associated sxs/prior Treatment) HPI Comments: Patient with history of non-ischemic cardiomyopathy and systolic CHF reports to ED with complaint of swelling, shortness of breath, and fatigue. Symptoms started on Sunday, constant in nature. Swelling is in lower extremities bilaterally, improved with elevation. Shortness of breath with remaining calm. Patient ran out of furosemide x 1 week. He is not weighing himself daily and reports reduced appetite. Denies chest pain, palpitations, fever, cough, or dizziness.   Patient is a 50 y.o. male presenting with shortness of breath. The history is provided by the patient and a relative.  Shortness of Breath Duration:  4 days Timing:  Constant Progression:  Unchanged Associated symptoms: no abdominal pain, no chest pain, no cough, no diaphoresis, no fever, no headaches, no neck pain, no rash, no sore throat, no vomiting and no wheezing   Risk factors: no hx of PE/DVT     Past Medical History  Diagnosis Date  . Dyspnea   . Congestive heart failure (HCC)   . Peripheral edema   . Cardiomegaly   . Hypertension   . Chronic kidney disease   . Coronary artery disease    History reviewed. No pertinent past surgical history. Family History  Problem Relation Age of Onset  . Hypertension Mother    Social History  Substance Use Topics  . Smoking status: Former Smoker -- 1.00 packs/day for 15 years    Types: Cigarettes    Quit date: 04/18/2006  . Smokeless tobacco: Never Used  . Alcohol Use: 0.0 - 0.5 oz/week    0-1 Standard drinks or equivalent per week     Comment: occasional per pt    Review of Systems  Constitutional: Positive for appetite change ( decreased secondary to scared to consume  salt) and fatigue. Negative for fever, chills and diaphoresis.  HENT: Negative for sore throat and trouble swallowing.   Eyes: Negative for visual disturbance.  Respiratory: Positive for shortness of breath. Negative for cough and wheezing.   Cardiovascular: Positive for leg swelling ( bilaterally). Negative for chest pain and palpitations.  Gastrointestinal: Positive for nausea ( in the am while brushing teeth, chronic). Negative for vomiting, abdominal pain, diarrhea, constipation and blood in stool.  Genitourinary: Negative for dysuria, frequency and hematuria.  Musculoskeletal: Negative for myalgias, arthralgias and neck pain.  Skin: Negative for rash.  Neurological: Positive for weakness ( generalized). Negative for dizziness, syncope, numbness and headaches.      Allergies  Shellfish allergy  Home Medications   Prior to Admission medications   Medication Sig Start Date End Date Taking? Authorizing Provider  aspirin 325 MG tablet Take 325 mg by mouth daily.   Yes Historical Provider, MD  carvedilol (COREG) 25 MG tablet TAKE ONE TABLET BY MOUTH TWICE DAILY 01/21/14  Yes Mihai Croitoru, MD  furosemide (LASIX) 80 MG tablet Take 1 tablet (80 mg total) by mouth daily. 12/05/14  Yes Mihai Croitoru, MD  hydrALAZINE (APRESOLINE) 50 MG tablet Take 1.5 tablets (75 mg total) by mouth 3 (three) times daily. 09/23/13  Yes Mihai Croitoru, MD  isosorbide mononitrate (IMDUR) 60 MG 24 hr tablet Take 0.5 tablets (30 mg total) by mouth 2 (two) times daily. 03/30/15  Yes Mihai Croitoru, MD  lisinopril (PRINIVIL,ZESTRIL) 40 MG tablet Take 1  tablet (40 mg total) by mouth daily. 01/19/15  Yes Mihai Croitoru, MD  potassium chloride SA (KLOR-CON M20) 20 MEQ tablet Take 1 tablet (20 mEq total) by mouth daily. 03/30/15  Yes Mihai Croitoru, MD  aspirin 81 MG tablet Take 81 mg by mouth daily. Reported on 08/05/2015    Historical Provider, MD  metolazone (ZAROXOLYN) 2.5 MG tablet Take 2.5 mg by mouth every other day.  Reported on 08/05/2015 09/19/13   Mihai Croitoru, MD   BP 133/103 mmHg  Pulse 102  Temp(Src) 98 F (36.7 C) (Oral)  Resp 22  Ht 5\' 10"  (1.778 m)  Wt 91.581 kg  BMI 28.97 kg/m2  SpO2 96% Physical Exam  Constitutional: He appears well-developed and well-nourished.  HENT:  Head: Normocephalic and atraumatic.  Mouth/Throat: Oropharynx is clear and moist. No oropharyngeal exudate.  Eyes: Conjunctivae and EOM are normal. Pupils are equal, round, and reactive to light. Right eye exhibits no discharge. Left eye exhibits no discharge. Scleral icterus is present.  Neck: Normal range of motion. Neck supple. No JVD present.  Cardiovascular: Regular rhythm and normal heart sounds.  Tachycardia present.   No murmur heard. Unable to obtain distal pulses secondary to edema; however, good capillary refill. 3+ pitting edema up to knees noted in extremities b/l. No anascara   Pulmonary/Chest: Breath sounds normal. He has no wheezes. He has no rales. He exhibits no tenderness.  tachypneic   Abdominal: Soft. Bowel sounds are normal. There is no tenderness. There is no rebound and no guarding.  Musculoskeletal: Normal range of motion.  Lymphadenopathy:    He has no cervical adenopathy.  Neurological: He is alert. Coordination normal.  Skin: Skin is warm and dry. He is not diaphoretic.  Psychiatric: He has a normal mood and affect.    ED Course  Procedures (including critical care time) Labs Review Labs Reviewed  CBC WITH DIFFERENTIAL/PLATELET - Abnormal; Notable for the following:    RDW 17.2 (*)    All other components within normal limits  BRAIN NATRIURETIC PEPTIDE - Abnormal; Notable for the following:    B Natriuretic Peptide 3254.3 (*)    All other components within normal limits  COMPREHENSIVE METABOLIC PANEL - Abnormal; Notable for the following:    CO2 18 (*)    Glucose, Bld 141 (*)    BUN 21 (*)    Creatinine, Ser 1.64 (*)    Albumin 3.3 (*)    Total Bilirubin 1.9 (*)    GFR calc  non Af Amer 48 (*)    GFR calc Af Amer 56 (*)    All other components within normal limits  TROPONIN I - Abnormal; Notable for the following:    Troponin I 0.13 (*)    All other components within normal limits    Imaging Review Dg Chest 2 View  08/05/2015  CLINICAL DATA:  Shortness of breath with swelling of the lower extremities over the last 5 days. EXAM: CHEST  2 VIEW COMPARISON:  05/20/2009 FINDINGS: Cardiac silhouette is enlarged consistent with cardiomegaly or pericardial effusion. The aorta is unfolded. There is pulmonary venous hypertension. No visible interstitial or alveolar edema. Tiny amount of fluid in the fissures but no measurable pleural effusion. No significant bone finding. IMPRESSION: Enlarged cardiac silhouette and pulmonary venous hypertension consistent with heart failure. Electronically Signed   By: Paulina Fusi M.D.   On: 08/05/2015 21:54   I have personally reviewed and evaluated these images and lab results as part of my medical decision-making. Enlarged cardiac  silhouette noted and appears consistent to previous chest x-ray. No consolidation, effusion, or evidence of pneumothorax noted. Evaluation concurs with radiology report.    EKG Interpretation   Date/Time:  Wednesday August 05 2015 21:39:29 EDT Ventricular Rate:  117 PR Interval:  152 QRS Duration: 102 QT Interval:  344 QTC Calculation: 479 R Axis:   38 Text Interpretation:  Sinus tachycardia Biatrial enlargement Left  ventricular hypertrophy with repolarization abnormality Abnormal ECG since  last tracing no significant change Confirmed by MILLER  MD, BRIAN ([id number])  on 08/05/2015 9:49:21 PM      MDM   Final diagnoses:  Acute on chronic congestive heart failure, unspecified congestive heart failure type (HCC)  Respiratory distress   Patient appears to be in acute on chronic CHF exacerbation given HPI, physical exam findings, known history of CHF with 10% LVEF, and elevated BNP. EKG shows tachycardia  with LVH. Elevated troponin; value is chronically elevated and most likely secondary to CHF. Serum Creatinine chronically elevated and stable. Patient given nitro and furosemide for management in ED. Dr. Hyacinth Meeker and myself consulted with Dr. Tresa Endo and patient admitted to cardiac service.     [address] [place], [place] 08/06/15 4782  Eber Hong, MD 08/06/15 [phone]

## 2015-08-06 NOTE — Progress Notes (Signed)
Patient lying in bed, will give Tylenol for pain. Family present at bedside call light within reach.

## 2015-08-06 NOTE — ED Notes (Signed)
Attempted to call report

## 2015-08-06 NOTE — Progress Notes (Signed)
  I have seen and examined Mr. Dwayne Cook.   I agree with Drs. Tresa Endo and [employer]. He appears to have advanced HF with low output and marked volume overload. He is pale and cool with mild speech latency. JVP is way up. There is a loud 3rd heart sound with an RV heave as well as hepatomegaly and 3+ edema.    I had a long talk with him that he is likely very close to the window where he will need advanced therapies including possibly inotropes, VAD or transplant. I told him that we may also be close to missing the window where we can offer him successful durable options for his HF due to multisystem organ failure.   I suggested we move him to stepdown today for placement of PICC line for monitoring of CVPs and co-ox with R/L heart cath in the near future (tomorrow or early next week) when he is a little better compensated.   He is obviously uncomfortable being in the hospital and getting medical care but we tried to work through this and reassure him that we would keep him updated at every step of the way. He asked for time to think this through. Given his affect and speech latency I wonder if his cognition may be somewhat impaired due to his cardiac output.   Agree with IV diuretics and Bidil for now. Add digoxin. No b-blocker. Hold ACE/ARB/ARNI prior to cath with CKD..   I will circle back with him this afternoon.   The patient is critically ill with multiple organ systems failure and requires high complexity decision making for assessment and support, frequent evaluation and titration of therapies, application of advanced monitoring technologies and extensive interpretation of multiple databases.   Critical Care Time devoted to patient care services described in this note is 45 Minutes.   Jessice Madill,MD 12:53 PM

## 2015-08-07 ENCOUNTER — Encounter (HOSPITAL_COMMUNITY)
Admission: EM | Disposition: A | Discharge status: Home / Self Care | Payer: Self-pay | Source: Home / Self Care | Attending: Cardiovascular Disease

## 2015-08-07 ENCOUNTER — Inpatient Hospital Stay (HOSPITAL_COMMUNITY): Payer: Self-pay

## 2015-08-07 DIAGNOSIS — I5023 Acute on chronic systolic (congestive) heart failure: Secondary | ICD-10-CM

## 2015-08-07 DIAGNOSIS — E876 Hypokalemia: Secondary | ICD-10-CM

## 2015-08-07 DIAGNOSIS — I509 Heart failure, unspecified: Secondary | ICD-10-CM

## 2015-08-07 DIAGNOSIS — I259 Chronic ischemic heart disease, unspecified: Secondary | ICD-10-CM

## 2015-08-07 LAB — BASIC METABOLIC PANEL
Anion gap: 15 (ref 5–15)
BUN: 20 mg/dL (ref 6–20)
CHLORIDE: 99 mmol/L — AB (ref 101–111)
CO2: 25 mmol/L (ref 22–32)
CREATININE: 1.76 mg/dL — AB (ref 0.61–1.24)
Calcium: 8.9 mg/dL (ref 8.9–10.3)
GFR, EST AFRICAN AMERICAN: 51 mL/min — AB (ref >60)
GFR, EST NON AFRICAN AMERICAN: 44 mL/min — AB (ref >60)
Glucose, Bld: 90 mg/dL (ref 65–99)
POTASSIUM: 2.8 mmol/L — AB (ref 3.5–5.1)
SODIUM: 139 mmol/L (ref 135–145)

## 2015-08-07 LAB — ECHOCARDIOGRAM COMPLETE
HEIGHTINCHES: 70 in
Weight: 2917.13 oz

## 2015-08-07 SURGERY — RIGHT/LEFT HEART CATH AND CORONARY ANGIOGRAPHY
Anesthesia: LOCAL

## 2015-08-07 MED ORDER — POTASSIUM CHLORIDE ER 10 MEQ PO TBCR
40.0000 meq | EXTENDED_RELEASE_TABLET | Freq: Three times a day (TID) | ORAL | Status: AC
  Administered 2015-08-07 (×3): 40 meq via ORAL
  Filled 2015-08-07 (×6): qty 4

## 2015-08-07 MED ORDER — OXYCODONE-ACETAMINOPHEN 5-325 MG PO TABS
1.0000 | ORAL_TABLET | ORAL | Status: DC | PRN
  Administered 2015-08-07 – 2015-08-11 (×6): 1 via ORAL
  Filled 2015-08-07 (×6): qty 1

## 2015-08-07 MED ORDER — FUROSEMIDE 10 MG/ML IJ SOLN
80.0000 mg | Freq: Every day | INTRAMUSCULAR | Status: DC
  Administered 2015-08-08: 80 mg via INTRAVENOUS
  Filled 2015-08-07: qty 8

## 2015-08-07 MED ORDER — SPIRONOLACTONE 25 MG PO TABS
12.5000 mg | ORAL_TABLET | Freq: Every day | ORAL | Status: DC
  Administered 2015-08-07 – 2015-08-13 (×7): 12.5 mg via ORAL
  Filled 2015-08-07 (×7): qty 1

## 2015-08-07 MED ORDER — MILRINONE IN DEXTROSE 20 MG/100ML IV SOLN: 0.2500 ug/kg/min | INTRAVENOUS | Status: DC

## 2015-08-07 NOTE — Care Management Note (Signed)
Case Management Note Donn Pierini RN, BSN Unit 2W-Case Manager [phone]  Patient Details  Name: Bienvenido Novosel MRN: [id number] Date of Birth: August 06, 1965  Subjective/Objective:    Pt admitted with HF, EF around 10%, ?LVAD candidate                Action/Plan: PTA pt lived at home- - CM to follow for possible d/c needs- pt has no insurance, needs f/u appointment at [facility] - has no PCP.   Expected Discharge Date:                  Expected Discharge Plan:  Home/Self Care  In-House Referral:     Discharge planning Services  CM Consult  Post Acute Care Choice:    Choice offered to:     DME Arranged:    DME Agency:     HH Arranged:    HH Agency:     Status of Service:  In process, will continue to follow  Medicare Important Message Given:    Date Medicare IM Given:    Medicare IM give by:    Date Additional Medicare IM Given:    Additional Medicare Important Message give by:     If discussed at Long Length of Stay Meetings, dates discussed:    Additional Comments:  Darrold Span, RN 08/07/2015, 1:12 PM

## 2015-08-07 NOTE — Consult Note (Signed)
[address]       [place] [address]             [phone]        Nathanuel Cabreja [facility] Medical Record #[id number] Date of Birth: Apr 09, 1966  Referring: Dr. Gala Romney  Primary Care: No PCP Per Patient  Chief Complaint:    Chief Complaint  Patient presents with  . Shortness of Breath  Patient examined, most recent echocardiogram and chest x-rays personally reviewed  History of Present Illness:     50 year old AA male hospitalized to the emergency department for the first time for his chronic heart failure. He is believed to have nonischemic cardiomyopathy from prolonged hypertension with severely reduced LVEF. Echocardiogram 2 years ago showed EF of 10% with fairly good RV function, mild-moderate AI and mild-moderate TR. The patient remained fairly well compensated on oral medications until recently when he developed symptoms of low cardiac output, dyspnea and PND, and severe peripheral edema and was admitted with a diagnosis of class IV CHF, acute on chronic systolic heart failure. He is maintained a sinus rhythm. He has not been on anticoagulation. His creatinine and LFTs are elevated. He is scheduled to undergo left and right heart catheterization after his volume overload is treated.   Current Activity/ Functional Status: Patient lives with his wife patient has been employed as a Public relations account executive He denies cigarette smoking or a significant alcohol intake   Zubrod Score: At the time of surgery this patient's most appropriate activity status/level should be described as: []     0    Normal activity, no symptoms []     1    Restricted in physical strenuous activity but ambulatory, able to do out light work []     2    Ambulatory and capable of self care, unable to do work activities, up and about                 more than 50%  Of the time                            [x]     3    Only limited self care, in bed greater than 50% of waking hours []      4    Completely disabled, no self care, confined to bed or chair []     5    Moribund  Past Medical History  Diagnosis Date  . Dyspnea   . Congestive heart failure (HCC)   . Peripheral edema   . Cardiomegaly   . Hypertension   . Chronic kidney disease   . Coronary artery disease     Past Surgical History  Procedure Laterality Date  . No past surgeries      History  Smoking status  . Former Smoker -- 1.00 packs/day for 15 years  . Types: Cigarettes  . Quit date: 04/18/2006  Smokeless tobacco  . Never Used    History  Alcohol Use  . 0.0 - 0.5 oz/week  . 0-1 Standard drinks or equivalent per week    Comment: occasional per pt    Social History   Social History  . Marital Status: Single    Spouse Name: N/A  . Number of Children: N/A  . Years of Education: N/A   Occupational History  . Not on file.   Social History Main Topics  . Smoking status: Former Smoker -- 1.00 packs/day for  15 years    Types: Cigarettes    Quit date: 04/18/2006  . Smokeless tobacco: Never Used  . Alcohol Use: 0.0 - 0.5 oz/week    0-1 Standard drinks or equivalent per week     Comment: occasional per pt  . Drug Use: No  . Sexual Activity: Not on file   Other Topics Concern  . Not on file   Social History Narrative     Allergies  Allergen Reactions  . Shellfish Allergy Anaphylaxis    Current Facility-Administered Medications  Medication Dose Route Frequency Provider Last Rate Last Dose  . 0.9 %  sodium chloride infusion  250 mL Intravenous PRN Leeann Must, MD      . acetaminophen (TYLENOL) tablet 650 mg  650 mg Oral Q4H PRN Leeann Must, MD   650 mg at 08/06/15 2244  . aspirin EC tablet 81 mg  81 mg Oral Daily Leeann Must, MD   81 mg at 08/06/15 4098  . feeding supplement (ENSURE ENLIVE) (ENSURE ENLIVE) liquid 237 mL  237 mL Oral BID BM Mihai Croitoru, MD   237 mL at 08/06/15 1100  . furosemide (LASIX) injection 80 mg  80 mg Intravenous Q8H Leeann Must, MD   80 mg at 08/07/15  0548  . heparin injection 5,000 Units  5,000 Units Subcutaneous Q8H Leeann Must, MD   5,000 Units at 08/07/15 0546  . hydrALAZINE (APRESOLINE) injection 10 mg  10 mg Intravenous Q6H PRN Leeann Must, MD      . isosorbide-hydrALAZINE (BIDIL) 20-37.5 MG per tablet 1 tablet  1 tablet Oral TID [name], MD   1 tablet at 08/06/15 2244  . ondansetron (ZOFRAN) injection 4 mg  4 mg Intravenous Q6H PRN Leeann Must, MD      . potassium chloride (K-DUR) CR tablet 40 mEq  40 mEq Oral TID Mihai Croitoru, MD      . sodium chloride flush (NS) 0.9 % injection 3 mL  3 mL Intravenous Q12H Leeann Must, MD   3 mL at 08/06/15 2251  . sodium chloride flush (NS) 0.9 % injection 3 mL  3 mL Intravenous PRN Leeann Must, MD        Prescriptions prior to admission  Medication Sig Dispense Refill Last Dose  . aspirin 325 MG tablet Take 325 mg by mouth daily.   Past Week at Unknown time  . carvedilol (COREG) 25 MG tablet TAKE ONE TABLET BY MOUTH TWICE DAILY 60 tablet 8 Past Week at Unknown time  . furosemide (LASIX) 80 MG tablet Take 1 tablet (80 mg total) by mouth daily. 90 tablet 1 Past Week at Unknown time  . hydrALAZINE (APRESOLINE) 50 MG tablet Take 1.5 tablets (75 mg total) by mouth 3 (three) times daily. 90 tablet 11 Past Week at Unknown time  . isosorbide mononitrate (IMDUR) 60 MG 24 hr tablet Take 0.5 tablets (30 mg total) by mouth 2 (two) times daily. 90 tablet 0 Past Week at Unknown time  . lisinopril (PRINIVIL,ZESTRIL) 40 MG tablet Take 1 tablet (40 mg total) by mouth daily. 30 tablet 5 Past Week at Unknown time  . potassium chloride SA (KLOR-CON M20) 20 MEQ tablet Take 1 tablet (20 mEq total) by mouth daily. 90 tablet 0 Past Week at Unknown time  . aspirin 81 MG tablet Take 81 mg by mouth daily. Reported on 08/05/2015   Not Taking at Unknown time  . metolazone (ZAROXOLYN) 2.5 MG tablet Take 2.5 mg by mouth every other day. Reported on 08/05/2015  Not Taking at Unknown time    Family History  Problem  Relation Age of Onset  . Hypertension Mother   Mother has nonischemic cardiomyopathy from hypertension and is in a facility   Review of Systems:       Cardiac Review of Systems: Y or N  Chest Pain [   no ]  Resting SOB [ yes  ] Exertional SOB  Mahler.Beck  ]  Orthopnea [ yes ]   Pedal Edema [ yes  ]    Palpitations Mahler.Beck  ] Syncope  [ no ]   Presyncope [ no  ]  General Review of Systems: [Y] = yes [  ]=no Constitional: recent weight change [yes increase  ]; anorexia [ yes ]; fatigue [ yes ]; nausea [  ]; night sweats [  ]; fever [  ]; or chills [  ]                                                               Dental: poor dentition[  ]; Last Dentist visit:Greater than one year   Eye : blurred vision [  ]; diplopia [   ]; vision changes [  ];  Amaurosis fugax[  ]; Resp: cough [  ];  wheezing[  ];  hemoptysis[  ]; shortness of breath[yes  ]; paroxysmal nocturnal dyspnea[yes  ]; dyspnea on exertioyes[yes  ]; or orthopnea[   yes];  GI:  gallstones[  ], vomiting[  ];  dysphagia[  ]; melena[  ];  hematochezia [  ]; heartburn[  ];   Hx of  Colonoscopy[  ]; GU: kidney stones [  ]; hematuria[  ];   dysuria [  ];  nocturia[  ];  history of     obstruction [  ]; urinary frequency [  ]             Skin: rash, swelling[  ];, hair loss[  ];  peripheral edema[  ];  or itching[  ]; Musculosketetal: myalgias[  ];  joint swelling[  ];  joint erythema[  ];  joint pain[  ];  back pain[  ];  Heme/Lymph: bruising[  ];  bleeding[  ];  anemia[  ];  Neuro: TIA[  ];  headaches[  ];  stroke[  ];  vertigo[  ];  seizures[  ];   paresthesias[  ];  difficulty walking[  ];  Psych:depression[  ]; anxiety[  ];  Endocrine: diabetes[  ];  thyroid dysfunction[  ];  Immunizations: Flu [  ]; Pneumococcal[  ];  Other:Right-hand dominant, no previous surgical operations, no history of thoracic trauma.  Physical Exam: BP 128/80 mmHg  Pulse 90  Temp(Src) 98.1 F (36.7 C) (Oral)  Resp 18  Ht 5\' 10"  (1.778 m)  Wt 182 lb 5.1 oz (82.7  kg)  BMI 26.16 kg/m2  SpO2 94%       Physical Exam  General: Young AA male appears chronically ill sitting on bedside, accompanied by mother HEENT: Normocephalic pupils equal , dentition adequate Neck: Supple  2+ JVD, without adenopathy, or bruit Chest: Clear to auscultation, symmetrical breath sounds, no rhonchi, no tenderness             or deformity Cardiovascular: Regular rate + S3 gallop  no murmur,  peripheral pulses  trace in all extremities Abdomen:  Soft, nontender, no palpable mass or organomegaly Extremities: Warm, well-perfused, no clubbing cyanosis edema or tenderness,              no venous stasis changes of the legs Rectal/GU: Deferred Neuro: Grossly non--focal and symmetrical throughout Skin: Clean and dry without rash or ulceration   Diagnostic Studies & Laboratory data:     Recent Radiology Findings:   Dg Chest 2 View  08/05/2015  CLINICAL DATA:  Shortness of breath with swelling of the lower extremities over the last 5 days. EXAM: CHEST  2 VIEW COMPARISON:  05/20/2009 FINDINGS: Cardiac silhouette is enlarged consistent with cardiomegaly or pericardial effusion. The aorta is unfolded. There is pulmonary venous hypertension. No visible interstitial or alveolar edema. Tiny amount of fluid in the fissures but no measurable pleural effusion. No significant bone finding. IMPRESSION: Enlarged cardiac silhouette and pulmonary venous hypertension consistent with heart failure. Electronically Signed   By: Paulina Fusi M.D.   On: 08/05/2015 21:54     I have independently reviewed the above radiologic studies.  Recent Lab Findings: Lab Results  Component Value Date   WBC 6.5 08/06/2015   HGB 13.9 08/06/2015   HCT 41.5 08/06/2015   PLT 263 08/06/2015   GLUCOSE 90 08/07/2015   ALT 25 08/05/2015   AST 38 08/05/2015   NA 139 08/07/2015   K 2.8* 08/07/2015   CL 99* 08/07/2015   CREATININE 1.76* 08/07/2015   BUN 20 08/07/2015   CO2 25 08/07/2015   TSH 1.496 08/06/2015     INR 1.39 08/06/2015   HGBA1C * 05/21/2009    7.0 (NOTE) The ADA recommends the following therapeutic goal for glycemic control related to Hgb A1c measurement: Goal of therapy: <6.5 Hgb A1c  Reference: American Diabetes Association: Clinical Practice Recommendations 2010, Diabetes Care, 2010, 33: (Suppl  1).      Assessment / Plan:     Acute on chronic systolic heart failure from nonischemic cardiomyopathy with severe LV dysfunction ejection fraction less than 10% Low output cardiogenic shock with chronic renal dysfunction History of hypertension  Patient appears to be next lung candidate for advanced therapies  The patient and his mother are very anxious about his condition and what lies ahead in  his future Full cardiac evaluation with right and left heart cath is pending  Will follow patient and initiate evaluation for possible mechanical cardiac assist when appropriate  08/07/2015 10:18 AM

## 2015-08-07 NOTE — Progress Notes (Signed)
  Echocardiogram 2D Echocardiogram has been performed.  Leta Jungling M 08/07/2015, 10:54 AM

## 2015-08-07 NOTE — Progress Notes (Signed)
Patient lying in bed, family present at bedside. Pain medication given for headache, call light within reach

## 2015-08-07 NOTE — Progress Notes (Signed)
At patient bedside to place PICC, after explaining procedure and purpose (CVP, Coox) of PICC patient states that he does not want PICC.  He states that there is no need to go any farther with explanation including risks because "it is all just too much right now".  Explained to patient that MD/NP would be notified and care plans would possibly need to be changed.  Patient and mother verbalize understanding. Gasper Lloyd, RN VAST PICC team

## 2015-08-07 NOTE — Progress Notes (Signed)
  He has worsening HF symptoms. Despite vigorous diuresis. More fatigued. + belly pain.   BP stable but HR fast and renal function worse.   Clearly low output on exam with JVP to jaw, prominent s3 and marked LE edema with cool extremities.   I had over a 45-minute discussion with him and his mother about the situation and options.   He stated clearly that he doesn't believe in doctors and doesn't want to be "cut on" in any way. I explained to him that cardiac cath and/or insertion of PICC line for hemodynamic monitoring did not involve "surgery" and I explained the procedure in detail.   I outlined 3 options for him for the weekend:  1) Home in am 2) PICC line with initiation of milrinone and following CVP and co-ox 3) Empiric trial of milrinone via PIV  He stated that he was getting angry and just wanted to go home. His mother pleaded with him to try the PICC line but he refused. I told him that I would check in with him tomorrow to recheck his decision. He said our treatment wasn't working so far and I explained that he hasn't let us treat him the way he needs to be treated. I also discussed with him that he will almost certainly die in the next few months without aggressive treatment of his HF.   I will check back with him in the am.  The patient is critically ill with multiple organ systems failure and requires high complexity decision making for assessment and support, frequent evaluation and titration of therapies, application of advanced monitoring technologies and extensive interpretation of multiple databases.   Critical Care Time devoted to patient care services described in this note is 45 Minutes. Bensimhon, Daniel,MD 7:30 PM

## 2015-08-07 NOTE — Progress Notes (Signed)
Informed by IV team RN patient refused PICC line. This RN inquired with patient and his mother. Patient sitting EOB, he is resting his elbows on bed side table with his head in his hands. His mother stated that he has had enough, he can't deal with it now, "Just leave him alone." Paged Tonye Becket, NP - updated her that patient refused PICC line, patient's behavior with resting his head in his hands [seeming to avoid eye contact] and his mother's statements.

## 2015-08-07 NOTE — Progress Notes (Signed)
Patient Name: Dwayne Cook Date of Encounter: 08/07/2015  Principal Problem:   Acute on chronic combined systolic and diastolic CHF, NYHA class 3 (HCC) Active Problems:   HTN (hypertension), malignant   CKD (chronic kidney disease), stage III   Acute on chronic systolic and diastolic heart failure, NYHA class 4 (HCC)   Cardiogenic shock (HCC)   Length of Stay: 2  SUBJECTIVE  Excellent diuresis, still has mild pedal edema. A little weak, severely hypokalemic. HR a little better, still with mild resting tachycardia. BP well controlled.  CURRENT MEDS . aspirin EC  81 mg Oral Daily  . feeding supplement (ENSURE ENLIVE)  237 mL Oral BID BM  . furosemide  80 mg Intravenous Q8H  . heparin  5,000 Units Subcutaneous Q8H  . isosorbide-hydrALAZINE  1 tablet Oral TID  . potassium chloride  40 mEq Oral TID  . sodium chloride flush  3 mL Intravenous Q12H    OBJECTIVE   Intake/Output Summary (Last 24 hours) at 08/07/15 0954 Last data filed at 08/07/15 0825  Gross per 24 hour  Intake      0 ml  Output   4800 ml  Net  -4800 ml   Filed Weights   08/06/15 0026 08/06/15 0347 08/07/15 0531  Weight: 91.581 kg (201 lb 14.4 oz) 86.5 kg (190 lb 11.2 oz) 82.7 kg (182 lb 5.1 oz)    PHYSICAL EXAM Filed Vitals:   08/06/15 0411 08/06/15 1300 08/06/15 2129 08/07/15 0531  BP: 125/85 130/89 115/78 128/80  Pulse: 98 100 94 90  Temp: 97.7 F (36.5 C) 97.3 F (36.3 C) 98.2 F (36.8 C) 98.1 F (36.7 C)  TempSrc: Oral Oral Oral Oral  Resp: 20  18 18   Height:      Weight:    82.7 kg (182 lb 5.1 oz)  SpO2: 94% 100% 90% 94%   General: Alert, oriented x3, no distress Head: no evidence of trauma, PERRL, EOMI, no exophtalmos or lid lag, no myxedema, no xanthelasma; normal ears, nose and oropharynx Neck: normal jugular venous pulsations and no hepatojugular reflux; brisk carotid pulses without delay and no carotid bruits Chest: clear to auscultation, no signs of consolidation by percussion or  palpation, normal fremitus, symmetrical and full respiratory excursions Cardiovascular: inferolaterally displaced apical impulse, left parasternal heave, regular rhythm, normal first and loud second heart sounds, no rubs, +ve summation gallop, no murmur Abdomen: no tenderness or distention, no masses by palpation, no abnormal pulsatility or arterial bruits, normal bowel sounds, mild hepatomegaly Extremities: no clubbing, cyanosis;2+ symmetrical pedal edema; 2+ radial, ulnar and brachial pulses bilaterally; 2+ right femoral, posterior tibial and dorsalis pedis pulses; 2+ left femoral, posterior tibial and dorsalis pedis pulses; no subclavian or femoral bruits Neurological: grossly nonfocal  LABS  CBC  Recent Labs  08/05/15 2146 08/06/15 0422  WBC 7.2 6.5  NEUTROABS 5.0 4.3  HGB 13.8 13.9  HCT 40.3 41.5  MCV 82.9 83.2  PLT 276 263   Basic Metabolic Panel  Recent Labs  08/06/15 0422 08/07/15 0218  NA 142 139  K 3.6 2.8*  CL 106 99*  CO2 22 25  GLUCOSE 98 90  BUN 20 20  CREATININE 1.65* 1.76*  CALCIUM 9.3 8.9  MG 1.9  --    Liver Function Tests  Recent Labs  08/05/15 2146  AST 38  ALT 25  ALKPHOS 99  BILITOT 1.9*  PROT 7.1  ALBUMIN 3.3*   No results for input(s): LIPASE, AMYLASE in the last 72 hours. Cardiac Enzymes  Recent Labs  08/06/15 0422 08/06/15 0938 08/06/15 1521  TROPONINI 0.15* 0.14* 0.12*   Thyroid Function Tests  Recent Labs  08/06/15 0422  TSH 1.496    Radiology Studies Imaging results have been reviewed and Dg Chest 2 View  08/05/2015  CLINICAL DATA:  Shortness of breath with swelling of the lower extremities over the last 5 days. EXAM: CHEST  2 VIEW COMPARISON:  05/20/2009 FINDINGS: Cardiac silhouette is enlarged consistent with cardiomegaly or pericardial effusion. The aorta is unfolded. There is pulmonary venous hypertension. No visible interstitial or alveolar edema. Tiny amount of fluid in the fissures but no measurable pleural  effusion. No significant bone finding. IMPRESSION: Enlarged cardiac silhouette and pulmonary venous hypertension consistent with heart failure. Electronically Signed   By: Dwayne Cook M.D.   On: 08/05/2015 21:54    TELE NSR/mild sinus tachycardia   ASSESSMENT AND PLAN  1. Acute on chronic systolic and diastolic heart failure, now probably mildly hypervolemic (unsure of dry weight). Responding well to diuretics with stable renal parameters. On vasodilators. Premature to restart carvedilol. Resume ACEi/ARB after cath. Consider Entresto if we can provide it.  2. 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 Dr. Gala Romney to perform R & L heart cath when close to euvolemia by dexam. He may need LVAD in the future. Repeat echo pending.  3. 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 Consider implantation next week after his HF is compensated.  4. Malignant HTN - At one point BP control was reasonable on carvedilol 25 mg BID, lisinopril 40 mg daily,hydralazine 75 mg TID, isosorbide 30 mg daily, metolazone 2.5 mg 3 days/week and oral furosemide 80 mg daily. Off meds for a while now, bidil dose restarted.  5. CKD stage 3, likely due to malignant HTN. No real change with marked diuresis  6. Hypokalemia - replace  Dwayne Fair, MD, [facility] [phone] office [phone] pager 08/07/2015 9:54 AM

## 2015-08-07 NOTE — Progress Notes (Signed)
Echo shows findings compatible with normal R and L heart pressures now. K very low. Will decrease dose of furosemide and add very low dose spironolactone.

## 2015-08-08 ENCOUNTER — Other Ambulatory Visit (HOSPITAL_COMMUNITY): Payer: Self-pay

## 2015-08-08 LAB — BASIC METABOLIC PANEL
ANION GAP: 15 (ref 5–15)
BUN: 20 mg/dL (ref 6–20)
CO2: 22 mmol/L (ref 22–32)
Calcium: 9.3 mg/dL (ref 8.9–10.3)
Chloride: 100 mmol/L — ABNORMAL LOW (ref 101–111)
Creatinine, Ser: 1.62 mg/dL — ABNORMAL HIGH (ref 0.61–1.24)
GFR calc Af Amer: 57 mL/min — ABNORMAL LOW (ref >60)
GFR calc non Af Amer: 49 mL/min — ABNORMAL LOW (ref >60)
GLUCOSE: 123 mg/dL — AB (ref 65–99)
POTASSIUM: 3.4 mmol/L — AB (ref 3.5–5.1)
Sodium: 137 mmol/L (ref 135–145)

## 2015-08-08 LAB — MAGNESIUM: Magnesium: 1.9 mg/dL (ref 1.7–2.4)

## 2015-08-08 MED ORDER — MILRINONE IN DEXTROSE 20 MG/100ML IV SOLN
0.1250 ug/kg/min | INTRAVENOUS | Status: DC
  Administered 2015-08-08 – 2015-08-11 (×6): 0.25 ug/kg/min via INTRAVENOUS
  Filled 2015-08-08 (×7): qty 100

## 2015-08-08 MED ORDER — POTASSIUM CHLORIDE CRYS ER 20 MEQ PO TBCR
40.0000 meq | EXTENDED_RELEASE_TABLET | Freq: Once | ORAL | Status: AC
  Administered 2015-08-08: 40 meq via ORAL
  Filled 2015-08-08: qty 2

## 2015-08-08 NOTE — Progress Notes (Signed)
Talked to him again. Still resistant to PICC line: "too much, too quickly". Agrees to allow blood draws and empirical low dose milrinone.

## 2015-08-08 NOTE — Progress Notes (Signed)
Paged cardiac MD on call through answering service.  Per the RN that answered the phone Dr. Deborah Chalk is on call, she would not provide me his page # but stated she would relay message:  Pt had 20 beat run of v-tach around1830, 9 beats around 1845 and another 5 beats around 1900.

## 2015-08-08 NOTE — Progress Notes (Addendum)
Patient would not allow his lab draw. Advised lab tech that he was going home and that there was no need. He also told her that his head was hurting again. Offered him pain med, an ice pack and a cool washcloth. Refused, said that he would just sit on the side of the bed.

## 2015-08-08 NOTE — Progress Notes (Signed)
Patient Name: Dwayne Cook Date of Encounter: 08/08/2015  Principal Problem:   Acute on chronic combined systolic and diastolic CHF, NYHA class 3 (HCC) Active Problems:   HTN (hypertension), malignant   CKD (chronic kidney disease), stage III   Acute on chronic systolic and diastolic heart failure, NYHA class 4 (HCC)   Cardiogenic shock (HCC)   Length of Stay: 3  SUBJECTIVE  Not very cooperative today. Won't really talk to me. Refused blood draws and told staff he was going home. Still very edematous and seems to have slept with legs hanging down, probably orthopneic. Another 2500 mL diuresis (net -9L, but intake is not correctly recorded). Weight down 20 lb.  CURRENT MEDS . aspirin EC  81 mg Oral Daily  . feeding supplement (ENSURE ENLIVE)  237 mL Oral BID BM  . furosemide  80 mg Intravenous Daily  . heparin  5,000 Units Subcutaneous Q8H  . isosorbide-hydrALAZINE  1 tablet Oral TID  . sodium chloride flush  3 mL Intravenous Q12H  . spironolactone  12.5 mg Oral Daily    OBJECTIVE   Intake/Output Summary (Last 24 hours) at 08/08/15 0825 Last data filed at 08/08/15 0547  Gross per 24 hour  Intake      0 ml  Output   2500 ml  Net  -2500 ml   Filed Weights   08/06/15 0347 08/07/15 0531 08/08/15 0546  Weight: 86.5 kg (190 lb 11.2 oz) 82.7 kg (182 lb 5.1 oz) 82.2 kg (181 lb 3.5 oz)    PHYSICAL EXAM Filed Vitals:   08/07/15 0531 08/07/15 1236 08/07/15 2030 08/08/15 0546  BP: 128/80 150/94 100/59 122/87  Pulse: 90 106 105 108  Temp: 98.1 F (36.7 C) 97.5 F (36.4 C) 98.1 F (36.7 C) 97.4 F (36.3 C)  TempSrc: Oral Oral Oral Oral  Resp: Height:      Weight: 82.7 kg (182 lb 5.1 oz)   82.2 kg (181 lb 3.5 oz)  SpO2: 94% 100% 98% 97%   General: Alert, oriented x3, no distress Head: no evidence of trauma, PERRL, EOMI, no exophtalmos or lid lag, no myxedema, no xanthelasma; normal ears, nose and oropharynx Neck: normal jugular venous pulsations and no  hepatojugular reflux; brisk carotid pulses without delay and no carotid bruits Chest: clear to auscultation, no signs of consolidation by percussion or palpation, normal fremitus, symmetrical and full respiratory excursions Cardiovascular: inferolaterally displaced apical impulse, left parasternal heave, regular rhythm, normal first and loud second heart sounds, no rubs, +ve summation gallop, no murmur Abdomen: no tenderness or distention, no masses by palpation, no abnormal pulsatility or arterial bruits, normal bowel sounds, no hepatosplenomegaly Extremities: no clubbing, cyanosis or edema; 2+ radial, ulnar and brachial pulses bilaterally; 2+ right femoral, posterior tibial and dorsalis pedis pulses; 2+ left femoral, posterior tibial and dorsalis pedis pulses; no subclavian or femoral bruits Neurological: grossly nonfocal  LABS  CBC  Recent Labs  08/05/15 2146 08/06/15 0422  WBC 7.2 6.5  NEUTROABS 5.0 4.3  HGB 13.8 13.9  HCT 40.3 41.5  MCV 82.9 83.2  PLT 276 263   Basic Metabolic Panel  Recent Labs  08/06/15 0422 08/07/15 0218  NA 142 139  K 3.6 2.8*  CL 106 99*  CO2 22 25  GLUCOSE 98 90  BUN 20 20  CREATININE 1.65* 1.76*  CALCIUM 9.3 8.9  MG 1.9  --    Liver Function Tests  Recent Labs  08/05/15 2146  AST 38  ALT 25  ALKPHOS 99  BILITOT 1.9*  PROT 7.1  ALBUMIN 3.3*   No results for input(s): LIPASE, AMYLASE in the last 72 hours. Cardiac Enzymes  Recent Labs  08/06/15 0422 08/06/15 0938 08/06/15 1521  TROPONINI 0.15* 0.14* 0.12*   Thyroid Function Tests  Recent Labs  08/06/15 0422  TSH 1.496    TELE One episode of PAT, 10 beats 175 bpm, one NSVT 5 beats, occasional ventricular couplets and triplets    ASSESSMENT AND PLAN  1. Acute on chronic systolic and diastolic heart failure, still hypervolemic (unsure of dry weight). Responding well to diuretics. On vasodilators. Premature to restart carvedilol. Resume ACEi/ARB after cath. Consider  Entresto if we can provide it. Possible LVAD candidate. His mistrust (fear?) of medical procedures and medical professionals is a major barrier to appropriate care (and has been for over 4 years now). At this point, it is hard to see how he would ever be able to cooperate with the complicated protocols necessary for OHT, VAD or even home inotropes.  2. 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 Dr. Gala Romney to perform R & L heart cath when he allows [place] and he is closer to euvolemia by exam. He may need LVAD in the future. Repeat echo EF 15%.  3. 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 Consider implantation next week after his HF is compensated.  4. Malignant HTN - At one point BP control was reasonable on carvedilol 25 mg BID, lisinopril 40 mg daily,hydralazine 75 mg TID, isosorbide 30 mg daily, metolazone 2.5 mg 3 days/week and oral furosemide 80 mg daily. Off meds for a while now, bidil dose restarted.  5. CKD stage 3, likely due to malignant HTN. No real change with marked diuresis  6. Hypokalemia - refused blood draw today.   [name], MD, [facility] [phone] office [phone] pager 08/08/2015 8:25 AM

## 2015-08-08 NOTE — Progress Notes (Signed)
I spoke with Dwayne Cook this am.   He refused blood draws this am and said he had a HA but refused treatment for it.   On exam, he looks a bit better with less edema, little warmer and less tachycardic so I think getting him back on the [place] curve may have helped some. That said, he is still extremely tenuous.   I asked him what he wanted to do and he said "Whatever I do is not going to be a popular decision. If I decide to stay if won't be popular with me. If I decide to go it won't be popular with my family."   Dr. Rubie Maid will speak him and his family later today. I outlined the options for him yesterday.   1) Home today 2) PICC line with initiation of milrinone and following CVP and co-ox. With cath early next week. 3) Empiric trial of milrinone via PIV (no PICC)  Please let us know how we can help.   Dwayne Cook, Daniel,MD 8:36 AM

## 2015-08-08 NOTE — Progress Notes (Signed)
Spoke with Patient at bedside regarding PICC placement.  Patient continues to refuse.  Notified pt that if he changes his mind we are still available at a later time to accomadate.

## 2015-08-09 LAB — BASIC METABOLIC PANEL
Anion gap: 12 (ref 5–15)
BUN: 19 mg/dL (ref 6–20)
CHLORIDE: 101 mmol/L (ref 101–111)
CO2: 24 mmol/L (ref 22–32)
CREATININE: 1.52 mg/dL — AB (ref 0.61–1.24)
Calcium: 9.5 mg/dL (ref 8.9–10.3)
GFR, EST NON AFRICAN AMERICAN: 53 mL/min — AB (ref >60)
Glucose, Bld: 119 mg/dL — ABNORMAL HIGH (ref 65–99)
Potassium: 3.8 mmol/L (ref 3.5–5.1)
SODIUM: 137 mmol/L (ref 135–145)

## 2015-08-09 MED ORDER — FUROSEMIDE 10 MG/ML IJ SOLN
80.0000 mg | Freq: Two times a day (BID) | INTRAMUSCULAR | Status: DC
  Administered 2015-08-09 – 2015-08-11 (×5): 80 mg via INTRAVENOUS
  Filled 2015-08-09 (×5): qty 8

## 2015-08-09 NOTE — Progress Notes (Signed)
Patient Name: Dwayne Cook Date of Encounter: 08/09/2015  Principal Problem:   Acute on chronic combined systolic and diastolic CHF, NYHA class 3 (HCC) Active Problems:   HTN (hypertension), malignant   CKD (chronic kidney disease), stage III   Acute on chronic systolic and diastolic heart failure, NYHA class 4 (HCC)   Cardiogenic shock (HCC)   Length of Stay: 4  SUBJECTIVE  He had a hard time sleeping, but over all appears calmer and less anxious. Lying back at 40 degrees. Edema is less. Maintains good UO, net negative almost 10L since admission. Creatinine is improving. A few episodes of NSVT (max 20 beats, seem to be fewer and shorter now that K is normal).  CURRENT MEDS . aspirin EC  81 mg Oral Daily  . feeding supplement (ENSURE ENLIVE)  237 mL Oral BID BM  . furosemide  80 mg Intravenous BID  . heparin  5,000 Units Subcutaneous Q8H  . isosorbide-hydrALAZINE  1 tablet Oral TID  . sodium chloride flush  3 mL Intravenous Q12H  . spironolactone  12.5 mg Oral Daily    OBJECTIVE   Intake/Output Summary (Last 24 hours) at 08/09/15 1027 Last data filed at 08/09/15 0358  Gross per 24 hour  Intake    840 ml  Output    975 ml  Net   -135 ml   Filed Weights   08/07/15 0531 08/08/15 0546 08/09/15 0357  Weight: 82.7 kg (182 lb 5.1 oz) 82.2 kg (181 lb 3.5 oz) 79.7 kg (175 lb 11.3 oz)    PHYSICAL EXAM Filed Vitals:   08/08/15 1430 08/08/15 2011 08/08/15 2359 08/09/15 0357  BP: 115/67 113/64 133/77 131/88  Pulse: 63 107 108 112  Temp: 97.8 F (36.6 C) 97.8 F (36.6 C) 97.9 F (36.6 C) 97.7 F (36.5 C)  TempSrc: Oral Oral Oral Oral  Resp: 18 18 18 18   Height:      Weight:    79.7 kg (175 lb 11.3 oz)  SpO2: 99% 97% 98% 96%   General: Alert, oriented x3, no distress Head: no evidence of trauma, PERRL, EOMI, no exophtalmos or lid lag, no myxedema, no xanthelasma; normal ears, nose and oropharynx Neck: normal jugular venous pulsations and no hepatojugular reflux;  brisk carotid pulses without delay and no carotid bruits Chest: clear to auscultation, no signs of consolidation by percussion or palpation, normal fremitus, symmetrical and full respiratory excursions Cardiovascular: inferolaterally displaced apical impulse, left parasternal heave, regular rhythm, normal first and loud second heart sounds, no rubs, +ve summation gallop, no murmur Abdomen: no tenderness or distention, no masses by palpation, no abnormal pulsatility or arterial bruits, normal bowel sounds, no hepatosplenomegaly Extremities: no clubbing, cyanosis; 1+ pretibial and 2+ pedal edema; 2+ radial, ulnar and brachial pulses bilaterally; 2+ right femoral, posterior tibial and dorsalis pedis pulses; 2+ left femoral, posterior tibial and dorsalis pedis pulses; no subclavian or femoral bruits Neurological: grossly nonfocal  LABS  CBC No results for input(s): WBC, NEUTROABS, HGB, HCT, MCV, PLT in the last 72 hours. Basic Metabolic Panel  Recent Labs  08/08/15 1417 08/08/15 1940 08/09/15 0453  NA 137  --  137  K 3.4*  --  3.8  CL 100*  --  101  CO2 22  --  24  GLUCOSE 123*  --  119*  BUN 20  --  19  CREATININE 1.62*  --  1.52*  CALCIUM 9.3  --  9.5  MG  --  1.9  --    Liver Function  Tests No results for input(s): AST, ALT, ALKPHOS, BILITOT, PROT, ALBUMIN in the last 72 hours. No results for input(s): LIPASE, AMYLASE in the last 72 hours. Cardiac Enzymes  Recent Labs  08/06/15 1521  TROPONINI 0.12*    TELE ST, occ NSVT   ASSESSMENT AND PLAN   1. Acute on chronic systolic and diastolic heart failure and cardiogenic shock, still hypervolemic (unsure of dry weight). Responding well to diuretics. On vasodilators. Premature to restart carvedilol. Resume ACEi/ARB after cath (if he lets [place] do one). Consider Entresto if we can provide it. Possible LVAD candidate. His mistrust (fear?) of medical procedures and medical professionals is a major barrier to appropriate care (and has  been for over 4 years now). At this point, it is hard to see how he would ever be able to cooperate with the complicated protocols necessary for OHT, VAD or even home inotropes. Encouraging evidence for improved cardiac output (warmer extremities, better creatinine). Increase diuretics again, including spironolactone. Plan another 3 days or so of milrinone until we achieve euvolemia. He really needs right and left heart catheterization, but remains very ambivalent about all medical procedures.  2. 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 Dr. Gala Romney to perform R & L heart cath when he allows [place] and he is closer to euvolemia by exam. He may need LVAD in the future. Repeat echo EF 15%.  3. 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 Consider implantation next week after his HF is compensated.  4. Malignant HTN - At one point BP control was reasonable on carvedilol 25 mg BID, lisinopril 40 mg daily,hydralazine 75 mg TID, isosorbide 30 mg daily, metolazone 2.5 mg 3 days/week and oral furosemide 80 mg daily. Off meds for a while now, bidil dose restarted.  5. CKD stage 3, likely due to malignant HTN. Improved paradoxically with marked diuresis, c/w improved CO  6. Hypokalemia - resolved  [name], MD, [facility] [phone] office [phone] pager 08/09/2015 10:27 AM

## 2015-08-09 NOTE — Progress Notes (Signed)
Pt has been extremely withdrawn, sitting in a dark room all day stating lights make his eyes hurt, he has been slouched over, no eye contact and refusing medications for pain/nausea.  I spoke with his mother outside the room tonight, she is very concerned and does not know how to get through to him to move forward with the treatment that he ultimately needs (per cardiology).  He is her only son and she has a sick mother in a facility in [place], where she also lives.  Sticky note left for Dr. Salena Saner, suggesting pt may benefit from a psych consult.

## 2015-08-09 NOTE — Progress Notes (Signed)
Utilization review completed.  

## 2015-08-10 LAB — BASIC METABOLIC PANEL
ANION GAP: 15 (ref 5–15)
BUN: 17 mg/dL (ref 6–20)
CHLORIDE: 99 mmol/L — AB (ref 101–111)
CO2: 23 mmol/L (ref 22–32)
Calcium: 9.4 mg/dL (ref 8.9–10.3)
Creatinine, Ser: 1.5 mg/dL — ABNORMAL HIGH (ref 0.61–1.24)
GFR calc Af Amer: >60 mL/min (ref >60)
GFR, EST NON AFRICAN AMERICAN: 54 mL/min — AB (ref >60)
GLUCOSE: 110 mg/dL — AB (ref 65–99)
POTASSIUM: 3.6 mmol/L (ref 3.5–5.1)
Sodium: 137 mmol/L (ref 135–145)

## 2015-08-10 NOTE — Progress Notes (Signed)
SUBJECTIVE:  Laying in bed in a dark room.  Makes no eye contact and minimal response to conversation.    OBJECTIVE:   Vitals:   Filed Vitals:   08/09/15 1300 08/09/15 1927 08/10/15 0022 08/10/15 0600  BP: 135/75 123/85 146/88 142/83  Pulse: 100 118 103 107  Temp: 97.5 F (36.4 C) 98.3 F (36.8 C) 87.7 F (30.9 C) 97.6 F (36.4 C)  TempSrc: Oral Oral Oral Oral  Resp: Height:      Weight:    168 lb 3.2 oz (76.295 kg)  SpO2: 95% 94% 96% 99%   I&O's:   Intake/Output Summary (Last 24 hours) at 08/10/15 0801 Last data filed at 08/10/15 0601  Gross per 24 hour  Intake    540 ml  Output   3725 ml  Net  -3185 ml   TELEMETRY: Reviewed telemetry pt in sinus tachycardia     PHYSICAL EXAM General: Well developed, well nourished, in no acute distress Head: Eyes PERRLA, No xanthomas.   Normal cephalic and atramatic  Lungs:   Clear bilaterally to auscultation and percussion. Heart:  Tachy  HRRR S1 S2 Pulses are 2+ & equal. Abdomen: Bowel sounds are positive, abdomen soft and non-tender without masses  Extremities:   No clubbing, cyanosis.  DP +1.  2+ pedal edema Neuro: Alert and oriented X 3. Psych:  Very withdrawn and will not make eye contact   LABS: Basic Metabolic Panel:  Recent Labs  91/47/82 1940 08/09/15 0453 08/10/15 0330  NA  --  137 137  K  --  3.8 3.6  CL  --  101 99*  CO2  --  24 23  GLUCOSE  --  119* 110*  BUN  --  19 17  CREATININE  --  1.52* 1.50*  CALCIUM  --  9.5 9.4  MG 1.9  --   --    Liver Function Tests: No results for input(s): AST, ALT, ALKPHOS, BILITOT, PROT, ALBUMIN in the last 72 hours. No results for input(s): LIPASE, AMYLASE in the last 72 hours. CBC: No results for input(s): WBC, NEUTROABS, HGB, HCT, MCV, PLT in the last 72 hours. Cardiac Enzymes: No results for input(s): CKTOTAL, CKMB, CKMBINDEX, TROPONINI in the last 72 hours. BNP: Invalid input(s): POCBNP D-Dimer: No results for input(s): DDIMER in the last 72  hours. Hemoglobin A1C: No results for input(s): HGBA1C in the last 72 hours. Fasting Lipid Panel: No results for input(s): CHOL, HDL, LDLCALC, TRIG, CHOLHDL, LDLDIRECT in the last 72 hours. Thyroid Function Tests: No results for input(s): TSH, T4TOTAL, T3FREE, THYROIDAB in the last 72 hours.  Invalid input(s): FREET3 Anemia Panel: No results for input(s): VITAMINB12, FOLATE, FERRITIN, TIBC, IRON, RETICCTPCT in the last 72 hours. Coag Panel:   Lab Results  Component Value Date   INR 1.39 08/06/2015   INR 1.44 05/22/2009    RADIOLOGY: Dg Chest 2 View  08/05/2015  CLINICAL DATA:  Shortness of breath with swelling of the lower extremities over the last 5 days. EXAM: CHEST  2 VIEW COMPARISON:  05/20/2009 FINDINGS: Cardiac silhouette is enlarged consistent with cardiomegaly or pericardial effusion. The aorta is unfolded. There is pulmonary venous hypertension. No visible interstitial or alveolar edema. Tiny amount of fluid in the fissures but no measurable pleural effusion. No significant bone finding. IMPRESSION: Enlarged cardiac silhouette and pulmonary venous hypertension consistent with heart failure. Electronically Signed   By: Paulina Fusi M.D.   On: 08/05/2015 21:54  ASSESSMENT AND PLAN   1. Acute on chronic systolic and diastolic heart failure and cardiogenic shock, still hypervolemic (unsure of dry weight). Responding well to diuretics. His UOP yesterday was 3.7L and net neg 13.3L.  On vasodilators. Premature to restart carvedilol. Resume ACEi/ARB after cath (if he lets [place] do one). Consider Entresto if we can provide it. Possible LVAD candidate. His mistrust (fear?) of medical procedures and medical professionals is a major barrier to appropriate care (and has been for over 4 years now). At this point, it is hard to see how he would ever be able to cooperate with the complicated protocols necessary for OHT, VAD or even home inotropes. Encouraging evidence for improved cardiac  output (warmer extremities, better creatinine). Continue Lasix and aldactone as he is responding well.  Plan another 2 days or so of milrinone until we achieve euvolemia. He really needs right and left heart catheterization, but remains very ambivalent about all medical procedures.  2. 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 Dr. Gala Romney to perform R & L heart cath when he allows [place] and he is closer to euvolemia by exam. He may need LVAD in the future. Repeat echo EF 15%.  3. 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 Consider implantation next week after his HF is compensated.  4. Malignant HTN - At one point BP control was reasonable on carvedilol 25 mg BID, lisinopril 40 mg daily,hydralazine 75 mg TID, isosorbide 30 mg daily, metolazone 2.5 mg 3 days/week and oral furosemide 80 mg daily. Off meds for a while now, bidil dose restarted.  5. CKD stage 3, likely due to malignant HTN. Improved paradoxically with marked diuresis, c/w improved CO.  Creatinine today 1.5.  6. Hypokalemia - resolved  7.  Psych - Patient has major mistrust and fears of medical professionals and procedures.  Nursing noted that patient has been extremely withdrawn, sitting in dark room all day with no eye contact and refusing meds of pain and nausea.    Will get Psych consult.    Quintella Reichert, MD  08/10/2015  8:01 AM

## 2015-08-11 DIAGNOSIS — N179 Acute kidney failure, unspecified: Secondary | ICD-10-CM

## 2015-08-11 LAB — BASIC METABOLIC PANEL
Anion gap: 16 — ABNORMAL HIGH (ref 5–15)
BUN: 21 mg/dL — AB (ref 6–20)
CO2: 23 mmol/L (ref 22–32)
CREATININE: 1.71 mg/dL — AB (ref 0.61–1.24)
Calcium: 9.5 mg/dL (ref 8.9–10.3)
Chloride: 97 mmol/L — ABNORMAL LOW (ref 101–111)
GFR, EST AFRICAN AMERICAN: 53 mL/min — AB (ref >60)
GFR, EST NON AFRICAN AMERICAN: 46 mL/min — AB (ref >60)
Glucose, Bld: 104 mg/dL — ABNORMAL HIGH (ref 65–99)
POTASSIUM: 3.6 mmol/L (ref 3.5–5.1)
SODIUM: 136 mmol/L (ref 135–145)

## 2015-08-11 MED ORDER — ISOSORB DINITRATE-HYDRALAZINE 20-37.5 MG PO TABS
2.0000 | ORAL_TABLET | Freq: Three times a day (TID) | ORAL | Status: DC
  Administered 2015-08-11 – 2015-08-12 (×3): 2 via ORAL
  Filled 2015-08-11 (×3): qty 2

## 2015-08-11 MED ORDER — DIGOXIN 125 MCG PO TABS
0.1250 mg | ORAL_TABLET | Freq: Every day | ORAL | Status: DC
  Administered 2015-08-11 – 2015-08-13 (×3): 0.125 mg via ORAL
  Filled 2015-08-11 (×3): qty 1

## 2015-08-11 MED ORDER — ISOSORB DINITRATE-HYDRALAZINE 20-37.5 MG PO TABS: 1.5000 | ORAL_TABLET | Freq: Three times a day (TID) | ORAL | Status: DC

## 2015-08-11 NOTE — Progress Notes (Signed)
Advanced Heart Failure Rounding Note  Primary Cardiologist: Dr Royann Shivers  Subjective:    Currently on milrinone 0.25. Creatinine 1.50 -> 1.71 on lasix 80 mg IV BID.   Continues to lie in bed. States he hasn't noticed much difference on milrinone. Hasn't been getting up out of bed.  No SOB or CP currently.   Out 2.6 L and down 4 lbs overnight. Down 16 L and 26 lbs overall.   Objective:   Weight Range: 164 lb 14.5 oz (74.8 kg) Body mass index is 23.66 kg/(m^2).   Vital Signs:   Temp:  [97.7 F (36.5 C)-98 F (36.7 C)] 98 F (36.7 C) (04/25 0251) Pulse Rate:  [101-111] 111 (04/25 0956) Resp:  [18] 18 (04/25 0956) BP: (122-139)/(83-92) 122/92 mmHg (04/25 0956) SpO2:  [95 %-99 %] 95 % (04/25 0956) Weight:  [164 lb 14.5 oz (74.8 kg)] 164 lb 14.5 oz (74.8 kg) (04/25 0251) Last BM Date: 08/05/15  Weight change: Filed Weights   08/09/15 0357 08/10/15 0600 08/11/15 0251  Weight: 175 lb 11.3 oz (79.7 kg) 168 lb 3.2 oz (76.295 kg) 164 lb 14.5 oz (74.8 kg)    Intake/Output:   Intake/Output Summary (Last 24 hours) at 08/11/15 1450 Last data filed at 08/11/15 1300  Gross per 24 hour  Intake  219.4 ml  Output   2600 ml  Net -2380.6 ml     Physical Exam: General:  Fatigued appearing,  No resp difficulty HEENT: normal Neck: supple. JVP 6-7. Carotids 2+ bilat; no bruits. No lymphadenopathy or thyromegaly appreciated. Cor: PMI nondisplaced. Regular rate & rhythm. No rubs, gallops or murmurs. Lungs: clear Abdomen: soft, nontender, nondistended. No hepatosplenomegaly. No bruits or masses. Good bowel sounds. Extremities: no cyanosis, clubbing, rash, edema Neuro: alert & orientedx3, cranial nerves grossly intact. moves all 4 extremities w/o difficulty. Flat affect   Telemetry: NSR  Labs: CBC No results for input(s): WBC, NEUTROABS, HGB, HCT, MCV, PLT in the last 72 hours. Basic Metabolic Panel  Recent Labs  08/08/15 1940  08/10/15 0330 08/11/15 0235  NA  --   < > 137  136  K  --   < > 3.6 3.6  CL  --   < > 99* 97*  CO2  --   < > 23 23  GLUCOSE  --   < > 110* 104*  BUN  --   < > 17 21*  CREATININE  --   < > 1.50* 1.71*  CALCIUM  --   < > 9.4 9.5  MG 1.9  --   --   --   < > = values in this interval not displayed. Liver Function Tests No results for input(s): AST, ALT, ALKPHOS, BILITOT, PROT, ALBUMIN in the last 72 hours. No results for input(s): LIPASE, AMYLASE in the last 72 hours. Cardiac Enzymes No results for input(s): CKTOTAL, CKMB, CKMBINDEX, TROPONINI in the last 72 hours.  BNP: BNP (last 3 results)  Recent Labs  08/05/15 2147  BNP 3254.3*    ProBNP (last 3 results) No results for input(s): PROBNP in the last 8760 hours.   D-Dimer No results for input(s): DDIMER in the last 72 hours. Hemoglobin A1C No results for input(s): HGBA1C in the last 72 hours. Fasting Lipid Panel No results for input(s): CHOL, HDL, LDLCALC, TRIG, CHOLHDL, LDLDIRECT in the last 72 hours. Thyroid Function Tests No results for input(s): TSH, T4TOTAL, T3FREE, THYROIDAB in the last 72 hours.  Invalid input(s): FREET3  Other results:  Imaging/Studies:   No results found.  Latest Echo  Latest Cath   Medications:     Scheduled Medications: . aspirin EC  81 mg Oral Daily  . digoxin  0.125 mg Oral Daily  . feeding supplement (ENSURE ENLIVE)  237 mL Oral BID BM  . heparin  5,000 Units Subcutaneous Q8H  . isosorbide-hydrALAZINE  2 tablet Oral TID  . sodium chloride flush  3 mL Intravenous Q12H  . spironolactone  12.5 mg Oral Daily     Infusions: . milrinone 0.125 mcg/kg/min (08/11/15 1438)     PRN Medications:  sodium chloride, acetaminophen, hydrALAZINE, ondansetron (ZOFRAN) IV, oxyCODONE-acetaminophen, sodium chloride flush   Assessment   1. 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 Severe cardiomyopathy 3. At high risk of sudden cardiac cath 4. Malignant HTN 5. CKD stage 3 6. Hypokalemia   Plan      Relatively stable though still states he is very fatigued.  Will consider for cath on Thursday if pt willing.   Decrease milrinone to 0.125 and will see how he is doing in morning.  Increase BIDIL to 2 tabs TID and add digoxin 0.125.   Hold evening dose of lasix with mild creatinine bump. Will assess for possible transition to po in am.   Length of Stay: 6   Graciella Freer PA-C 08/11/2015, 2:50 PM  Advanced Heart Failure Team Pager [phone] (M-F; 7a - 4p)  Please contact [facility] for night-coverage after hours (4p -7a ) and weekends on [email or web address]   Patient seen and examined with Otilio Saber, PA-C. We discussed all aspects of the encounter. I agree with the assessment and plan as stated above.  Overall feeling slightly better. Volume status much improved. Renal function worse. Now likely euvolemic/mildly hypovolemic/ Stop lasix. Drop milrinone to 0.125. Increase Bidil to 2 tabs tid and add digoxin 0.125 daily. Will consult CR. We again discussed the possibility of R and L heart cath and he is willing to consider. Will try to wean milrinone to off tomorrow and plan cath Thursday if he agrees.  Jerianne Anselmo,MD 4:34 PM

## 2015-08-11 NOTE — Progress Notes (Signed)
1519 Came to see pt to walk. Fast asleep. Family member in room tried to wake. Will let pt sleep and follow up tomorrow. Encouraged walking with staff later. Luetta Nutting RN BSN 08/11/2015 3:20 PM

## 2015-08-12 DIAGNOSIS — F4321 Adjustment disorder with depressed mood: Secondary | ICD-10-CM

## 2015-08-12 LAB — BASIC METABOLIC PANEL
Anion gap: 15 (ref 5–15)
BUN: 21 mg/dL — AB (ref 6–20)
CALCIUM: 9.6 mg/dL (ref 8.9–10.3)
CHLORIDE: 99 mmol/L — AB (ref 101–111)
CO2: 23 mmol/L (ref 22–32)
CREATININE: 1.67 mg/dL — AB (ref 0.61–1.24)
GFR, EST AFRICAN AMERICAN: 55 mL/min — AB (ref >60)
GFR, EST NON AFRICAN AMERICAN: 47 mL/min — AB (ref >60)
Glucose, Bld: 113 mg/dL — ABNORMAL HIGH (ref 65–99)
Potassium: 3.5 mmol/L (ref 3.5–5.1)
SODIUM: 137 mmol/L (ref 135–145)

## 2015-08-12 MED ORDER — HYDRALAZINE HCL 50 MG PO TABS
75.0000 mg | ORAL_TABLET | Freq: Three times a day (TID) | ORAL | Status: DC
  Administered 2015-08-12 – 2015-08-13 (×3): 75 mg via ORAL
  Filled 2015-08-12 (×3): qty 1

## 2015-08-12 MED ORDER — POTASSIUM CHLORIDE CRYS ER 20 MEQ PO TBCR
20.0000 meq | EXTENDED_RELEASE_TABLET | Freq: Once | ORAL | Status: AC
  Administered 2015-08-12: 20 meq via ORAL
  Filled 2015-08-12: qty 1

## 2015-08-12 MED ORDER — CITALOPRAM HYDROBROMIDE 20 MG PO TABS
20.0000 mg | ORAL_TABLET | Freq: Every day | ORAL | Status: DC
  Administered 2015-08-12 – 2015-08-13 (×2): 20 mg via ORAL
  Filled 2015-08-12 (×2): qty 1

## 2015-08-12 NOTE — Progress Notes (Signed)
Utilization review completed.  

## 2015-08-12 NOTE — Progress Notes (Signed)
Attempted x2 today so far. First time pt asleep and asked me to return in 30 min. 1 1/2 hr later pt having his neck rubbed and sts he can't right now. Sts he will walk, that he likes to be active. Requested we f/u after lunch.  Ethelda Chick CES, ACSM 11:43 AM 08/12/2015

## 2015-08-12 NOTE — Progress Notes (Signed)
Advanced Heart Failure Rounding Note  Primary Cardiologist: Dr Royann Shivers  Subjective:    Now on  milrinone 0.125. Creatinine 1.50 -> 1.71 -> 1.67 with lasix held.   Walked a short distance with CR. BP stable on increased regimen. Didn't notice a difference with decrease in milrinone. Still feeling weak and fatigued. Was not really SOB walking.   Out 0.9 L but up 2 lbs overnight. Down 16.8 L and at least 24 lbs overall.    Objective:   Weight Range: 166 lb 7.2 oz (75.5 kg) Body mass index is 23.88 kg/(m^2).   Vital Signs:   Temp:  [97.5 F (36.4 C)-98.1 F (36.7 C)] 97.5 F (36.4 C) (04/26 0457) Pulse Rate:  [91-120] 91 (04/26 0457) Resp:  [18-20] 18 (04/26 0457) BP: (118-141)/(73-92) 118/78 mmHg (04/26 0457) SpO2:  [95 %-99 %] 99 % (04/26 0457) Weight:  [166 lb 7.2 oz (75.5 kg)] 166 lb 7.2 oz (75.5 kg) (04/26 0457) Last BM Date: 08/05/15  Weight change: Filed Weights   08/10/15 0600 08/11/15 0251 08/12/15 0457  Weight: 168 lb 3.2 oz (76.295 kg) 164 lb 14.5 oz (74.8 kg) 166 lb 7.2 oz (75.5 kg)    Intake/Output:   Intake/Output Summary (Last 24 hours) at 08/12/15 0928 Last data filed at 08/12/15 0459  Gross per 24 hour  Intake    120 ml  Output   1150 ml  Net  -1030 ml     Physical Exam: General:  Fatigued appearing,  No resp difficulty HEENT: normal Neck: supple. JVP 6-7. Carotids 2+ bilat; no bruits. No thyromegaly or nodule noted Cor: PMI nondisplaced. RRR. No rubs, gallops or murmurs. Lungs: CTAB, normal effort Abdomen: soft, nontender, nondistended. No hepatosplenomegaly. No bruits or masses. Good bowel sounds. Extremities: no cyanosis, clubbing, rash, edema Neuro: alert & orientedx3, cranial nerves grossly intact. moves all 4 extremities w/o difficulty. Flat affect   Telemetry: NSR  Labs: CBC No results for input(s): WBC, NEUTROABS, HGB, HCT, MCV, PLT in the last 72 hours. Basic Metabolic Panel  Recent Labs  08/11/15 0235 08/12/15 0241  NA  136 137  K 3.6 3.5  CL 97* 99*  CO2 23 23  GLUCOSE 104* 113*  BUN 21* 21*  CREATININE 1.71* 1.67*  CALCIUM 9.5 9.6   Liver Function Tests No results for input(s): AST, ALT, ALKPHOS, BILITOT, PROT, ALBUMIN in the last 72 hours. No results for input(s): LIPASE, AMYLASE in the last 72 hours. Cardiac Enzymes No results for input(s): CKTOTAL, CKMB, CKMBINDEX, TROPONINI in the last 72 hours.  BNP: BNP (last 3 results)  Recent Labs  08/05/15 2147  BNP 3254.3*    ProBNP (last 3 results) No results for input(s): PROBNP in the last 8760 hours.   D-Dimer No results for input(s): DDIMER in the last 72 hours. Hemoglobin A1C No results for input(s): HGBA1C in the last 72 hours. Fasting Lipid Panel No results for input(s): CHOL, HDL, LDLCALC, TRIG, CHOLHDL, LDLDIRECT in the last 72 hours. Thyroid Function Tests No results for input(s): TSH, T4TOTAL, T3FREE, THYROIDAB in the last 72 hours.  Invalid input(s): FREET3  Other results:     Imaging/Studies:  No results found.  Latest Echo  Latest Cath   Medications:     Scheduled Medications: . aspirin EC  81 mg Oral Daily  . digoxin  0.125 mg Oral Daily  . feeding supplement (ENSURE ENLIVE)  237 mL Oral BID BM  . heparin  5,000 Units Subcutaneous Q8H  . isosorbide-hydrALAZINE  2 tablet  Oral TID  . sodium chloride flush  3 mL Intravenous Q12H  . spironolactone  12.5 mg Oral Daily    Infusions: . milrinone 0.125 mcg/kg/min (08/11/15 1438)    PRN Medications: sodium chloride, acetaminophen, hydrALAZINE, ondansetron (ZOFRAN) IV, oxyCODONE-acetaminophen, sodium chloride flush   Assessment   1. 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 Severe cardiomyopathy 3. At high risk of sudden cardiac cath 4. Malignant HTN 5. CKD stage 3 6. Hypokalemia 7. Depression, situational   Plan    Relatively stable though still states he is very fatigued. Walked some with CR. States not SOB just fatigued.   Will  consider for cath Tomorrow morning if pt willing. Wishes to make final decision this evening.    Stop milrinone and follow symptoms. Leave off lasix for now.   Getting HAs from imdur in [place].  Will switch to Hydralazine 75 mg TID alone.    Will try celexa.   Length of Stay: 7   Graciella Freer PA-C 08/12/2015, 9:28 AM  Advanced Heart Failure Team Pager [phone] (M-F; 7a - 4p)  Please contact [facility] for night-coverage after hours (4p -7a ) and weekends on [email or web address]  Patient seen and examined with Otilio Saber, PA-C. We discussed all aspects of the encounter. I agree with the assessment and plan as stated above.   Volume status much improved. Renal function improved after holding diuretics for 1 day. Will resume tomorrow. He walked hall with PT and was very fatigued - NYHA IIIB. Unable to tolerate [place] due to severe HAs. Will switch to hydralazine 75 tid. No b-blocker yet. Consider ARB after cath.  Will stop milrinone today. Long discussion about possible R/L cath in am. He want to wait until am to decide. Will keep NPO p MN. Start Celexa for depression.   Hykeem Ojeda,MD 10:06 PM

## 2015-08-12 NOTE — Progress Notes (Signed)
CARDIAC REHAB PHASE I   PRE:  Rate/Rhythm: 107 ST    BP: sitting 120/90    SaO2: 96 RA  MODE:  Ambulation: 120 ft   POST:  Rate/Rhythm: 119 ST    BP: sitting 130/100     SaO2: 93 RA  Pt continued to not want to walk even with much coercing.  Eventually he agreed with his son's prompting. Seems generally weak, used RW and gait belt for assist. Right leg with less control than left but when I asked him about it, he said his left foot hurt. Denied SOB or dizziness. Just sts he feels so tired and weak. Return to EOB. Will f/u tomorrow as time permits. [phone]  Harriet Masson CES, ACSM 08/12/2015 2:22 PM

## 2015-08-13 LAB — BASIC METABOLIC PANEL
Anion gap: 13 (ref 5–15)
BUN: 18 mg/dL (ref 6–20)
CHLORIDE: 102 mmol/L (ref 101–111)
CO2: 23 mmol/L (ref 22–32)
Calcium: 9.8 mg/dL (ref 8.9–10.3)
Creatinine, Ser: 1.43 mg/dL — ABNORMAL HIGH (ref 0.61–1.24)
GFR calc non Af Amer: 57 mL/min — ABNORMAL LOW (ref >60)
Glucose, Bld: 132 mg/dL — ABNORMAL HIGH (ref 65–99)
POTASSIUM: 3.7 mmol/L (ref 3.5–5.1)
SODIUM: 138 mmol/L (ref 135–145)

## 2015-08-13 MED ORDER — SACUBITRIL-VALSARTAN 24-26 MG PO TABS: 1.0000 | ORAL_TABLET | Freq: Two times a day (BID) | ORAL | Status: DC

## 2015-08-13 MED ORDER — CITALOPRAM HYDROBROMIDE 20 MG PO TABS: 20.0000 mg | ORAL_TABLET | Freq: Every day | ORAL | Status: DC

## 2015-08-13 MED ORDER — METOLAZONE 2.5 MG PO TABS: 2.5000 mg | ORAL_TABLET | ORAL | Status: DC | PRN

## 2015-08-13 MED ORDER — SPIRONOLACTONE 25 MG PO TABS: 12.5000 mg | ORAL_TABLET | Freq: Every day | ORAL | Status: DC

## 2015-08-13 MED ORDER — DIGOXIN 125 MCG PO TABS: 0.1250 mg | ORAL_TABLET | Freq: Every day | ORAL | Status: DC

## 2015-08-13 NOTE — Progress Notes (Signed)
Advanced Heart Failure Rounding Note  Primary Cardiologist: Dr Royann Shivers  Subjective:    Wants to go home. Unable to sleep. Denies SOB.    Objective:   Weight Range: 162 lb 0.6 oz (73.5 kg) Body mass index is 23.25 kg/(m^2).   Vital Signs:   Temp:  [97.8 F (36.6 C)-98.2 F (36.8 C)] 97.8 F (36.6 C) (04/27 0524) Pulse Rate:  [93-103] 98 (04/27 0524) Resp:  [18-20] 18 (04/27 0524) BP: (121-144)/(80-92) 141/92 mmHg (04/27 0524) SpO2:  [94 %-100 %] 94 % (04/27 0524) Weight:  [162 lb 0.6 oz (73.5 kg)] 162 lb 0.6 oz (73.5 kg) (04/27 0524) Last BM Date: 08/05/15  Weight change: Filed Weights   08/11/15 0251 08/12/15 0457 08/13/15 0524  Weight: 164 lb 14.5 oz (74.8 kg) 166 lb 7.2 oz (75.5 kg) 162 lb 0.6 oz (73.5 kg)    Intake/Output:   Intake/Output Summary (Last 24 hours) at 08/13/15 0849 Last data filed at 08/12/15 2000  Gross per 24 hour  Intake    363 ml  Output   1000 ml  Net   -637 ml     Physical Exam: General:  Fatigued appearing,  No resp difficulty. On the side of the bed.  HEENT: normal Neck: supple. JVP 6-7. Carotids 2+ bilat; no bruits. No thyromegaly or nodule noted Cor: PMI nondisplaced. RRR. No rubs, gallops or murmurs. Lungs: CTAB, normal effort Abdomen: soft, nontender, nondistended. No hepatosplenomegaly. No bruits or masses. Good bowel sounds. Extremities: no cyanosis, clubbing, rash, edema Neuro: alert & orientedx3, cranial nerves grossly intact. moves all 4 extremities w/o difficulty. Flat affect   Telemetry: NSR  Labs: CBC No results for input(s): WBC, NEUTROABS, HGB, HCT, MCV, PLT in the last 72 hours. Basic Metabolic Panel  Recent Labs  08/12/15 0241 08/13/15 0253  NA 137 138  K 3.5 3.7  CL 99* 102  CO2 23 23  GLUCOSE 113* 132*  BUN 21* 18  CREATININE 1.67* 1.43*  CALCIUM 9.6 9.8   Liver Function Tests No results for input(s): AST, ALT, ALKPHOS, BILITOT, PROT, ALBUMIN in the last 72 hours. No results for input(s):  LIPASE, AMYLASE in the last 72 hours. Cardiac Enzymes No results for input(s): CKTOTAL, CKMB, CKMBINDEX, TROPONINI in the last 72 hours.  BNP: BNP (last 3 results)  Recent Labs  08/05/15 2147  BNP 3254.3*    ProBNP (last 3 results) No results for input(s): PROBNP in the last 8760 hours.   D-Dimer No results for input(s): DDIMER in the last 72 hours. Hemoglobin A1C No results for input(s): HGBA1C in the last 72 hours. Fasting Lipid Panel No results for input(s): CHOL, HDL, LDLCALC, TRIG, CHOLHDL, LDLDIRECT in the last 72 hours. Thyroid Function Tests No results for input(s): TSH, T4TOTAL, T3FREE, THYROIDAB in the last 72 hours.  Invalid input(s): FREET3  Other results:     Imaging/Studies:  No results found.  Latest Echo  Latest Cath   Medications:     Scheduled Medications: . aspirin EC  81 mg Oral Daily  . citalopram  20 mg Oral Daily  . digoxin  0.125 mg Oral Daily  . feeding supplement (ENSURE ENLIVE)  237 mL Oral BID BM  . heparin  5,000 Units Subcutaneous Q8H  . hydrALAZINE  75 mg Oral TID  . sodium chloride flush  3 mL Intravenous Q12H  . spironolactone  12.5 mg Oral Daily    Infusions:    PRN Medications: sodium chloride, acetaminophen, hydrALAZINE, ondansetron (ZOFRAN) IV, oxyCODONE-acetaminophen, sodium chloride  flush   Assessment   1. 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 Severe cardiomyopathy 3. At high risk of sudden cardiac cath 4. Malignant HTN 5. CKD stage 3 6. Hypokalemia 7. Depression, situational   Plan    Refusing cath. Wants to go home. Send home today with follow up in HF clinic next week.    No BB for now. Continue digoxin, spiro 12.5 mg daily, hydralazine 75 mg TID alone, entresto 24-26 mg twice a day and lasix 80 mg daily.      Length of Stay: 8   Amy Clegg NP-C  08/13/2015, 8:49 AM  Advanced Heart Failure Team Pager [phone] (M-F; 7a - 4p)  Please contact [facility] for  night-coverage after hours (4p -7a ) and weekends on [email or web address]  Patient seen and examined with Tonye Becket, NP. We discussed all aspects of the encounter. I agree with the assessment and plan as stated above.   Long talk about possibility of cath. He continues to refuse. Says he feels terrible but can't tell the difference off of milrinone. Wants to go home today. Will send home on above regimen. Hold carvedilol for now. Will use Entresto instead of lisinopril. He has no insurance so will give him card for 30 days free then work on getting him funding for it out of the HF Clinic.   I discussed with Dr. Rubie Maid as well.   Bensimhon, Daniel,MD 10:58 AM

## 2015-08-13 NOTE — Progress Notes (Signed)
CARDIAC REHAB PHASE I   Pt sitting on edge of bed still, head down, not making eye contact, declines ambulation for the second time, states he is waiting to go home. Pt agreeable to review CHF education. Completed CHF education with pt, pt's mother and son at bedside. Reviewed CHF booklet and zone tool, daily weights, fluid and sodium restrictions, heart healthy diet, exercise guidelines and phase 2 cardiac rehab. Encouraged medication compliance and follow up with heart failure clinic. Pt verbalized understanding, somewhat receptive to education. Pt interested in phase 2 cardiac referral, will send to [facility] per pt request. Pt in bed, call bell within reach, awaiting discharge.  [phone] Joylene Grapes, RN, BSN 08/13/2015 11:37 AM

## 2015-08-13 NOTE — Progress Notes (Signed)
Patient Discharged home, patient's son picked up patient to take home, and will be with patient.  Patient tele box removed and CCMD contacted and made aware.  IV DC'ed clean, dry, and intact.  Patient discharged, per pt wanted to go home, did not want cardiac cath.  Patient was instructed to follow up with HF clinic, Patient received referral for [facility], a 30 day free card, and will get help from HF clinic from there.  Went over meds, and discharge summary with mother and patient, mother and patient verbalized understanding and medications were sent to [facility], mother stated she would cover cost since he had no insurance.  Patient was given a list of local resources in this area.  CHF education provided to patient and mother, verbalized understanding, given CHF instructions/education, and instructed patient to F/U with HF clinic, as well as remain med compliant, verbalized understanding-both patient and mother.   Thanks, Albertine Grates

## 2015-08-13 NOTE — Progress Notes (Signed)
CARDIAC REHAB PHASE I   Pt in room, sitting on edge of bed, pt declines ambulation/education at this time, states he is waiting for his breakfast tray. Pt mom at bedside. Will attempt to follow up with pt prior to discharge as schedule permits.   Joylene Grapes, RN, BSN 08/13/2015 9:25 AM

## 2015-08-13 NOTE — Care Management Note (Signed)
Case Management Note Donn Pierini RN, BSN Unit 2W-Case Manager [phone]  Patient Details  Name: Dwayne Cook MRN: [id number] Date of Birth: [birth year]  Subjective/Objective:    Pt admitted with HF, EF around 10%, ?LVAD candidate                Action/Plan: PTA pt lived at home- - CM to follow for possible d/c needs- pt has no insurance, needs f/u appointment at [facility] - has no PCP.   Expected Discharge Date:     08/13/15             Expected Discharge Plan:  Home/Self Care  In-House Referral:     Discharge planning Services  CM Consult, Medication Assistance, Indigent Health Clinic  Post Acute Care Choice:    Choice offered to:     DME Arranged:    DME Agency:     HH Arranged:    HH Agency:     Status of Service:  Completed, signed off  Medicare Important Message Given:    Date Medicare IM Given:    Medicare IM give by:    Date Additional Medicare IM Given:    Additional Medicare Important Message give by:     If discussed at Long Length of Stay Meetings, dates discussed:    Additional Comments:  08/13/15- 1145- Donn Pierini RN BSN- pt for d/c today- does not want cardiac cath- to follow up with HF clinic- referral for Entrestro - 30 day free card given to pt- HF clinic to assist with pt assistance- as pt is self pay/no insurance- spoke with pt and family at bedside- reviewed medications with pt that where sent to [facility] to fill- 2 meds are on $4 list- other 2 are around $22/each- Tivis Ringer will be free with card provided)-  pt's mom states that she will cover cost of meds  at discharge- so pt will not need MATCH at this time. Pt also given info and list of local clinics if needed-  in addition to the f/u that he will be receiving at the HF clinic.   Darrold Span, RN 08/13/2015, 11:44 AM

## 2015-08-13 NOTE — Discharge Summary (Signed)
Advanced Heart Failure Discharge Note   Discharge Summary   Patient ID: Byrant Cook MRN: [id number], DOB/AGE: July 06, 1965 50 y.o. Admit date: 08/05/2015 D/C date:     08/13/2015   Primary Discharge Diagnoses:  1. 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 Severe cardiomyopathy 3. At high risk of sudden cardiac cath 4. Malignant HTN 5. CKD stage 3 6. Hypokalemia 7. Depression, situational  Hospital Course:   Dwayne Cook is a 50 y.o. with past medical history of Malignant HTN and chronic combined HF who preneted to [facility] 08/05/15 with progressive weight gain, dyspnea, and leg swelling. BP in 140s PTA he had continued to work 20-30 hrs a week since the holiday season.   On HF team exam pt was pale and cool with mild speech latency, elevated JVP, +S3, RV heave, + hepatomegaly and 3+ edema. Thought likely to be in low output and PICC requested for CVP and coox. Pt subsequently refused PICC line but did submit to empiric milrinone trial via Peripheral IV.   Echo 08/07/15 LVEF 15%, grade 1 DD, trivial MR, Mod LAE, RV moderately reduced, mild RAE, PA peak pressure 39 mm Hg.   Brisk diuresis noted throughout admission. Had several episodes of NSVT. Hypokalemia down to 2.8 and supped aggressively.   He continued to refuse blood draws and progressive treatment, and was regularly withdrawn from staff and providers.  Dr. Gala Romney had many frank discussions with pt and his family in regards to his prognosis without medication adherence.   He stated he did not notice much difference on milrinone and tolerated wean without complication. Pt initially considered L/RHC, but despite several conversations concerning its importance, opted not to proceed.  He did not feel any better on milrinone and wished to go home and chose to opt out of any further work up at this time despite discussion of the risk, including sudden cardiac death. Patient verbalized that he understands risk but wished to  go home at this time.  Pt thought to be stable for discharge on current medications with close HF follow. Will continue to hold BB with concerns for low output HF.    Pt is at high risk for re-admission due to non-adherence to medical regimen and his degree of disease. We will continue to engage the patient with disease state education, medication reconcilication, and close follow up with continued work up as able while patient increases his comfort level with medical care.   Overall he diuresed 17.8 L and at least 20 lbs from his highest weight this admission.  He will be discharged to home in stable condition with close follow up in the HF clinic as below.   Discharge Weight Range: 162 lbs Discharge Vitals: Blood pressure 141/92, pulse 96, temperature 97.8 F (36.6 C), temperature source Oral, resp. rate 18, height  (1.778 m), weight 162 lb 0.6 oz (73.5 kg), SpO2 93 %.  Labs: Lab Results  Component Value Date   WBC 6.5 08/06/2015   HGB 13.9 08/06/2015   HCT 41.5 08/06/2015   MCV 83.2 08/06/2015   PLT 263 08/06/2015     Recent Labs Lab 08/13/15 0253  NA 138  K 3.7  CL 102  CO2 23  BUN 18  CREATININE 1.43*  CALCIUM 9.8  GLUCOSE 132*   No results found for: CHOL, HDL, LDLCALC, TRIG BNP (last 3 results)  Recent Labs  08/05/15 2147  BNP 3254.3*    ProBNP (last 3 results) No results for input(s): PROBNP in the  last 8760 hours.   Diagnostic Studies/Procedures   No results found.  Discharge Medications     Medication List    STOP taking these medications        carvedilol 25 MG tablet  Commonly known as:  COREG     isosorbide mononitrate 60 MG 24 hr tablet  Commonly known as:  IMDUR     lisinopril 40 MG tablet  Commonly known as:  PRINIVIL,ZESTRIL      TAKE these medications        aspirin 81 MG tablet  Take 81 mg by mouth daily. Reported on 08/05/2015     citalopram 20 MG tablet  Commonly known as:  CELEXA  Take 1 tablet (20 mg total) by  mouth daily.     digoxin 0.125 MG tablet  Commonly known as:  LANOXIN  Take 1 tablet (0.125 mg total) by mouth daily.     furosemide 80 MG tablet  Commonly known as:  LASIX  Take 1 tablet (80 mg total) by mouth daily.     hydrALAZINE 50 MG tablet  Commonly known as:  APRESOLINE  Take 1.5 tablets (75 mg total) by mouth 3 (three) times daily.     metolazone 2.5 MG tablet  Commonly known as:  ZAROXOLYN  Take 1 tablet (2.5 mg total) by mouth as needed. Reported on 08/05/2015     potassium chloride SA 20 MEQ tablet  Commonly known as:  KLOR-CON M20  Take 1 tablet (20 mEq total) by mouth daily.     sacubitril-valsartan 24-26 MG  Commonly known as:  ENTRESTO  Take 1 tablet by mouth 2 (two) times daily.     spironolactone 25 MG tablet  Commonly known as:  ALDACTONE  Take 0.5 tablets (12.5 mg total) by mouth daily.        Disposition   The patient will be discharged in stable condition to home. Discharge Instructions    Amb Referral to Cardiac Rehabilitation    Complete by:  As directed   Diagnosis:  Heart Failure (see criteria below if ordering Phase II)  Heart Failure Type:  Chronic Systolic & Diastolic     Diet - low sodium heart healthy    Complete by:  As directed      Heart Failure patients record your daily weight using the same scale at the same time of day    Complete by:  As directed      Increase activity slowly    Complete by:  As directed           Follow-up Information    Follow up with Arvilla Meres, MD On 08/18/2015.   Specialty:  Cardiology   Why:  at 1020 for post hospital follow up.  Please bring all of your medications to your visit. The code for patient parking is 0002.   Contact information:   [address] [place] [address] [phone]         Duration of Discharge Encounter: Greater than 35 minutes   Signed, Graciella Freer PA-C 08/13/2015, 1:35 PM  Patient seen and examined with Otilio Saber, PA-C. We  discussed all aspects of the encounter. I agree with the assessment and plan as stated above.   He has severe HF but continue to express distrust for medical staff and says he does not want to be "cut on." We will continue to follow in the HF Clinic and hopefully we can make some progress. Dr. Rubie Maid has been  intimately involved in his care as well.   Bensimhon, Daniel,MD 11:15 PM

## 2015-08-18 ENCOUNTER — Encounter (HOSPITAL_COMMUNITY): Payer: Self-pay | Admitting: Internal Medicine

## 2015-08-18 ENCOUNTER — Encounter (HOSPITAL_COMMUNITY): Payer: Self-pay | Admitting: Pharmacist

## 2015-08-18 ENCOUNTER — Ambulatory Visit (HOSPITAL_COMMUNITY)
Admit: 2015-08-18 | Discharge: 2015-08-18 | Disposition: A | Discharge status: Home / Self Care | Payer: Self-pay | Source: Ambulatory Visit | Attending: Internal Medicine | Admitting: Internal Medicine

## 2015-08-18 VITALS — BP 142/80 | HR 111 | Wt 164.5 lb

## 2015-08-18 DIAGNOSIS — Z7982 Long term (current) use of aspirin: Secondary | ICD-10-CM | POA: Insufficient documentation

## 2015-08-18 DIAGNOSIS — I5043 Acute on chronic combined systolic (congestive) and diastolic (congestive) heart failure: Secondary | ICD-10-CM | POA: Insufficient documentation

## 2015-08-18 DIAGNOSIS — Z8249 Family history of ischemic heart disease and other diseases of the circulatory system: Secondary | ICD-10-CM | POA: Insufficient documentation

## 2015-08-18 DIAGNOSIS — I2510 Atherosclerotic heart disease of native coronary artery without angina pectoris: Secondary | ICD-10-CM | POA: Insufficient documentation

## 2015-08-18 DIAGNOSIS — N183 Chronic kidney disease, stage 3 (moderate): Secondary | ICD-10-CM | POA: Insufficient documentation

## 2015-08-18 DIAGNOSIS — F4321 Adjustment disorder with depressed mood: Secondary | ICD-10-CM | POA: Insufficient documentation

## 2015-08-18 DIAGNOSIS — Z87891 Personal history of nicotine dependence: Secondary | ICD-10-CM | POA: Insufficient documentation

## 2015-08-18 DIAGNOSIS — Z79899 Other long term (current) drug therapy: Secondary | ICD-10-CM | POA: Insufficient documentation

## 2015-08-18 DIAGNOSIS — I130 Hypertensive heart and chronic kidney disease with heart failure and stage 1 through stage 4 chronic kidney disease, or unspecified chronic kidney disease: Secondary | ICD-10-CM | POA: Insufficient documentation

## 2015-08-18 LAB — BASIC METABOLIC PANEL
ANION GAP: 11 (ref 5–15)
BUN: 16 mg/dL (ref 6–20)
CALCIUM: 10 mg/dL (ref 8.9–10.3)
CO2: 23 mmol/L (ref 22–32)
Chloride: 105 mmol/L (ref 101–111)
Creatinine, Ser: 1.54 mg/dL — ABNORMAL HIGH (ref 0.61–1.24)
GFR calc non Af Amer: 52 mL/min — ABNORMAL LOW (ref >60)
Glucose, Bld: 107 mg/dL — ABNORMAL HIGH (ref 65–99)
POTASSIUM: 4.8 mmol/L (ref 3.5–5.1)
Sodium: 139 mmol/L (ref 135–145)

## 2015-08-18 MED ORDER — POTASSIUM CHLORIDE CRYS ER 20 MEQ PO TBCR: 20.0000 meq | EXTENDED_RELEASE_TABLET | Freq: Every day | ORAL | Status: AC

## 2015-08-18 MED ORDER — SPIRONOLACTONE 25 MG PO TABS: 12.5000 mg | ORAL_TABLET | Freq: Every day | ORAL | Status: DC

## 2015-08-18 MED ORDER — SACUBITRIL-VALSARTAN 49-51 MG PO TABS: 1.0000 | ORAL_TABLET | Freq: Two times a day (BID) | ORAL | Status: DC

## 2015-08-18 MED ORDER — HYDRALAZINE HCL 50 MG PO TABS: 75.0000 mg | ORAL_TABLET | Freq: Three times a day (TID) | ORAL | Status: DC

## 2015-08-18 MED ORDER — DIGOXIN 125 MCG PO TABS: 0.1250 mg | ORAL_TABLET | Freq: Every day | ORAL | Status: AC

## 2015-08-18 MED ORDER — FUROSEMIDE 80 MG PO TABS: 80.0000 mg | ORAL_TABLET | Freq: Every day | ORAL | Status: DC

## 2015-08-18 MED ORDER — IVABRADINE HCL 5 MG PO TABS: 5.0000 mg | ORAL_TABLET | Freq: Two times a day (BID) | ORAL | Status: DC

## 2015-08-18 MED FILL — hydrALAZINE HCL 50 MG TABS: 50 | 30 days supply | Qty: 135 | Fill #0

## 2015-08-18 MED FILL — SPIRONOLACTONE 25 MG TABLET: 25 | 30 days supply | Qty: 15 | Fill #0

## 2015-08-18 MED FILL — DIGITEK 125 MCG TABLET: 125 | 30 days supply | Qty: 30 | Fill #0

## 2015-08-18 MED FILL — FUROSEMIDE 80 MG TABLET: 80 | 30 days supply | Qty: 30 | Fill #0

## 2015-08-18 MED FILL — KLOR-CON M20 TABLET: 20 | 30 days supply | Qty: 30 | Fill #0

## 2015-08-18 NOTE — Patient Instructions (Signed)
Increase Entresto 49/51 mg Twice daily   Start Corlanor 5 mg Twice daily   Labs today  You have been referred to [facility]  Your physician recommends that you schedule a follow-up appointment in: 3 weeks

## 2015-08-18 NOTE — Progress Notes (Signed)
ADVANCED HF CLINIC NOTE  Patient ID: Dwayne Cook, male   DOB: 1966/03/22, 50 y.o.   MRN: [id number] PCP: Primary Cardiologist: Croituro  HPI:  Dwayne Cook is a 49 y.o. with past medical history of Malignant HTN and chronic combined HF who preneted to [facility] 08/05/15 with progressive weight gain, dyspnea, and leg swelling. BP in 140s PTA he had continued to work 20-30 hrs a week since the holiday season.   On HF team exam pt was pale and cool with mild speech latency, elevated JVP, +S3, RV heave, + hepatomegaly and 3+ edema. Thought likely to be in low output and PICC requested for CVP and coox. Pt subsequently refused PICC line but did submit to empiric milrinone trial via Peripheral IV.   Echo 08/07/15 LVEF 15%, grade 1 DD, trivial MR, Mod LAE, RV moderately reduced, mild RAE, PA peak pressure 39 mm Hg.   Brisk diuresis noted throughout admission. Had several episodes of NSVT. Hypokalemia down to 2.8 and supped aggressively. He continue to refuse blood draws and progressive treatment, and was regularly withdrawn from staff and providers. Dr. Gala Romney had many frank discussions with pt and his family in regards to his prognosis without medication adherence. D/c weight 162 pounds.   Here for post-hospital f/u. Still feels very tired. Can get around house ok. But can't do much else. Headaches resolved off Imdur. No dizzness. Denies dyspnea or CP. Just profound fatigue. No edema. Not weighing. Taking all meds.   ROS: All systems negative except as listed in HPI, PMH and Problem List.  SH:  Social History   Social History  . Marital Status: Single    Spouse Name: N/A  . Number of Children: N/A  . Years of Education: N/A   Occupational History  . Not on file.   Social History Main Topics  . Smoking status: Former Smoker -- 1.00 packs/day for 15 years    Types: Cigarettes    Quit date: 04/18/2006  . Smokeless tobacco: Never Used  . Alcohol Use: 0.0 - 0.5 oz/week    0-1  Standard drinks or equivalent per week     Comment: occasional per pt  . Drug Use: No  . Sexual Activity: Not on file   Other Topics Concern  . Not on file   Social History Narrative    FH:  Family History  Problem Relation Age of Onset  . Hypertension Mother     Past Medical History  Diagnosis Date  . Dyspnea   . Congestive heart failure (HCC)   . Peripheral edema   . Cardiomegaly   . Hypertension   . Chronic kidney disease   . Coronary artery disease     Current Outpatient Prescriptions  Medication Sig Dispense Refill  . aspirin 81 MG tablet Take 81 mg by mouth daily. Reported on 08/05/2015    . citalopram (CELEXA) 20 MG tablet Take 1 tablet (20 mg total) by mouth daily. 30 tablet 6  . digoxin (LANOXIN) 0.125 MG tablet Take 1 tablet (0.125 mg total) by mouth daily. 30 tablet 6  . furosemide (LASIX) 80 MG tablet Take 1 tablet (80 mg total) by mouth daily. 90 tablet 1  . hydrALAZINE (APRESOLINE) 50 MG tablet Take 1.5 tablets (75 mg total) by mouth 3 (three) times daily. 90 tablet 11  . potassium chloride SA (KLOR-CON M20) 20 MEQ tablet Take 1 tablet (20 mEq total) by mouth daily. 90 tablet 0  . sacubitril-valsartan (ENTRESTO) 24-26 MG Take 1 tablet by mouth  2 (two) times daily. 60 tablet 6  . spironolactone (ALDACTONE) 25 MG tablet Take 0.5 tablets (12.5 mg total) by mouth daily. 30 tablet 6  . metolazone (ZAROXOLYN) 2.5 MG tablet Take 1 tablet (2.5 mg total) by mouth as needed. Reported on 08/05/2015 (Patient not taking: Reported on 08/18/2015) 10 tablet 6   No current facility-administered medications for this encounter.    Filed Vitals:   08/18/15 1034  BP: 142/80  Pulse: 111  Weight: 164 lb 8 oz (74.617 kg)  SpO2: 100%    PHYSICAL EXAM:  General: Fatigued appearing, No resp difficulty. Sitting in chair HEENT: normal Neck: supple. JVP 6-7. Carotids 2+ bilat; no bruits. No thyromegaly or nodule noted Cor: PMI laterally displaced. Tachy regular +s3  Lungs:  CTAB, normal effort Abdomen: soft, nontender, nondistended. No hepatosplenomegaly. No bruits or masses. Good bowel sounds. Extremities: no cyanosis, clubbing, rash, edema Neuro: alert & orientedx3, cranial nerves grossly intact. moves all 4 extremities w/o difficulty. Flat affect with poor eye contact   ASSESSMENT & PLAN:  1. Acute on chronic combined HF and cardiogenic shock - NYHA IIIB-IV 2. Malignant HTN 3. CKD stage 3 4. Depression, situational  Very tenuous. NYHA IIIB-IV. Volume status ok. Will increase Entresto 49/51 bid. Cannot tolerate b-blocker due to low output. Add corlanor 5 bid. Long talk about need to proceed with work-up for advanced therapies. He seems to be more accepting of this but not ready to move forward yet. We will talk again soon. When/if ready proceed with R/L heart cath. Needs HHPT. Continue Celexa.   Total time spent 45 minutes. Over half that time spent discussing above.    Dwayne Barnier,MD 11:18 AM

## 2015-08-20 ENCOUNTER — Telehealth (HOSPITAL_COMMUNITY): Payer: Self-pay | Admitting: Pharmacist

## 2015-08-20 NOTE — Telephone Encounter (Signed)
[employer] approved Entresto 49-51 mg BID patient assistance through 08/18/16 at no cost to patient.   Tyler Deis. Bonnye Fava, PharmD, BCPS, CPP Clinical Pharmacist Pager: [phone] Phone: [phone] 08/20/2015 1:53 PM

## 2015-08-24 ENCOUNTER — Telehealth (HOSPITAL_COMMUNITY): Payer: Self-pay | Admitting: Pharmacist

## 2015-08-24 NOTE — Telephone Encounter (Signed)
[place] approved assistance for [employer] through 08/20/16.   Tyler Deis. Bonnye Fava, PharmD, BCPS, CPP Clinical Pharmacist Pager: [phone] Phone: [phone] 08/24/2015 11:42 AM

## 2015-09-08 ENCOUNTER — Encounter (HOSPITAL_COMMUNITY): Payer: Self-pay

## 2015-09-15 NOTE — Progress Notes (Signed)
Advanced Heart Failure Clinic Note   Patient ID: [name], male   DOB: [birth year], 50 y.o.   MRN: [id number]   PCP: None Primary Cardiologist: Croituro Primary HF: Dr. Gala Romney   HPI:  Dwayne Cook is a 50 y.o. with past medical history of Malignant HTN and chronic combined HF who preneted to [facility] 08/05/15 with progressive weight gain, dyspnea, and leg swelling. BP in 140s PTA he had continued to work 20-30 hrs a week since the holiday season.   On HF team exam pt was pale and cool with mild speech latency, elevated JVP, +S3, RV heave, + hepatomegaly and 3+ edema. Thought likely to be in low output and PICC requested for CVP and coox. Pt subsequently refused PICC line but did submit to empiric milrinone trial via Peripheral IV.   Echo 08/07/15 LVEF 15%, grade 1 DD, trivial MR, Mod LAE, RV moderately reduced, mild RAE, PA peak pressure 39 mm Hg.   Admitted 4/19 -> 08/13/15. Brisk diuresis noted throughout admission. Had several episodes of NSVT. Hypokalemia down to 2.8 and supped aggressively. He continue to refuse blood draws and progressive treatment, and was regularly withdrawn from staff and providers. Dr. Gala Romney had many frank discussions with pt and his family in regards to his prognosis without medication adherence. D/c weight 162 pounds.   He returns today for regular follow up. At last visit entresto increased and corlanor added. He is up 8 lbs from last visit. Since last visit, he has stopped hydralazine, Entresto, and lasix.  He occasionally tries them, but gets headaches and stomach aches.  Doesn't think he has had lasix in a while. Gets around the house ok, but not very active otherwise. Appetite good. Mom is trying to watch his salt and fluid in take.    ROS: All systems negative except as listed in HPI, PMH and Problem List.  SH:  Social History   Social History  . Marital Status: Single    Spouse Name: N/A  . Number of Children: N/A  . Years of  Education: N/A   Occupational History  . Not on file.   Social History Main Topics  . Smoking status: Former Smoker -- 1.00 packs/day for 15 years    Types: Cigarettes    Quit date: 04/18/2006  . Smokeless tobacco: Never Used  . Alcohol Use: 0.0 - 0.5 oz/week    0-1 Standard drinks or equivalent per week     Comment: occasional per pt  . Drug Use: No  . Sexual Activity: Not on file   Other Topics Concern  . Not on file   Social History Narrative    FH:  Family History  Problem Relation Age of Onset  . Hypertension Mother     Past Medical History  Diagnosis Date  . Dyspnea   . Congestive heart failure (HCC)   . Peripheral edema   . Cardiomegaly   . Hypertension   . Chronic kidney disease   . Coronary artery disease     Current Outpatient Prescriptions  Medication Sig Dispense Refill  . aspirin 81 MG tablet Take 81 mg by mouth daily. Reported on 08/05/2015    . citalopram (CELEXA) 20 MG tablet Take 1 tablet (20 mg total) by mouth daily. 30 tablet 6  . digoxin (LANOXIN) 0.125 MG tablet Take 1 tablet (0.125 mg total) by mouth daily. 30 tablet 2  . potassium chloride SA (KLOR-CON M20) 20 MEQ tablet Take 1 tablet (20 mEq total) by mouth daily.  30 tablet 2  . spironolactone (ALDACTONE) 25 MG tablet Take 0.5 tablets (12.5 mg total) by mouth daily. 30 tablet 2   No current facility-administered medications for this encounter.    Filed Vitals:   09/16/15 1003  BP: 174/122  Pulse: 103  Weight: 172 lb 3.2 oz (78.109 kg)  SpO2: 99%   Wt Readings from Last 3 Encounters:  09/16/15 172 lb 3.2 oz (78.109 kg)  08/18/15 164 lb 8 oz (74.617 kg)  08/13/15 162 lb 0.6 oz (73.5 kg)     PHYSICAL EXAM:  General: Fatigued appearing, No resp difficulty. In WC. Mom and brother present.  HEENT: normal Neck: supple. JVP 8-9. Carotids 2+ bilat; no bruits. No thyromegaly or nodule noted Cor: PMI laterally displaced. Tachy regular +s3 2/6 MR.  Lungs: CTAB, normal effort Abdomen:  soft, non-tender, non-distended, no HSM. No bruits or masses. +BS  Extremities: no cyanosis, clubbing, rash, Trace ankle edema Neuro: alert & orientedx3, cranial nerves grossly intact. moves all 4 extremities w/o difficulty. Flat affect with poor eye contact   ASSESSMENT & PLAN:  1. Acute on chronic combined HF and cardiogenic shock - NYHA IIIB-IV 2. Malignant HTN 3. CKD stage 3 4. Depression, situational  Remains very tenuous. NYHA IIIB. Volume status up. Will decrease Entresto back to 24/26 bid. Cannot tolerate b-blocker due to low output. Hold corlanor. Resume hydralazine.   Resume lasix 80 mg daily.  Can use metolazone AS NEEDED.   Previously had long talks about need to proceed with work-up for advanced therapies. He is not ready to move forward yet. When/if ready proceed with R/L heart cath. Continue Celexa, will refill. Needs to establish with PCP.   Gave and filled Pillbox.   Follow up in 2 weeks.   Graciella Freer, PA-C 10:14 AM   Patient seen and examined with Otilio Saber, PA-C. We discussed all aspects of the encounter. I agree with the assessment and plan as stated above.   Overall relatively stable NYHA III-IIIb. He has lots of confusion over his meds and feels some of them are making him nauseated. We went over hid meds in detail and also had our pharmacy team load them into pill boxes. Can cut Entresto in half due to lightheadedness. No b-blocker yet. He is not ready to move forward with work-up for advanced therapies. Will continue to follow as closely as he will permit.   Total time spent 45 minutes. Over half that time spent discussing above.   Helvi Royals,MD 10:55 PM

## 2015-09-16 ENCOUNTER — Ambulatory Visit (HOSPITAL_COMMUNITY)
Admission: RE | Admit: 2015-09-16 | Discharge: 2015-09-16 | Disposition: A | Discharge status: Home / Self Care | Payer: Self-pay | Source: Ambulatory Visit | Attending: Student | Admitting: Student

## 2015-09-16 VITALS — BP 174/122 | HR 103 | Wt 172.2 lb

## 2015-09-16 DIAGNOSIS — I2510 Atherosclerotic heart disease of native coronary artery without angina pectoris: Secondary | ICD-10-CM | POA: Insufficient documentation

## 2015-09-16 DIAGNOSIS — I5043 Acute on chronic combined systolic (congestive) and diastolic (congestive) heart failure: Secondary | ICD-10-CM | POA: Insufficient documentation

## 2015-09-16 DIAGNOSIS — Z8249 Family history of ischemic heart disease and other diseases of the circulatory system: Secondary | ICD-10-CM | POA: Insufficient documentation

## 2015-09-16 DIAGNOSIS — Z87891 Personal history of nicotine dependence: Secondary | ICD-10-CM | POA: Insufficient documentation

## 2015-09-16 DIAGNOSIS — Z7982 Long term (current) use of aspirin: Secondary | ICD-10-CM | POA: Insufficient documentation

## 2015-09-16 DIAGNOSIS — I130 Hypertensive heart and chronic kidney disease with heart failure and stage 1 through stage 4 chronic kidney disease, or unspecified chronic kidney disease: Secondary | ICD-10-CM | POA: Insufficient documentation

## 2015-09-16 DIAGNOSIS — F4321 Adjustment disorder with depressed mood: Secondary | ICD-10-CM | POA: Insufficient documentation

## 2015-09-16 DIAGNOSIS — Z79899 Other long term (current) drug therapy: Secondary | ICD-10-CM | POA: Insufficient documentation

## 2015-09-16 DIAGNOSIS — N183 Chronic kidney disease, stage 3 (moderate): Secondary | ICD-10-CM | POA: Insufficient documentation

## 2015-09-16 LAB — BASIC METABOLIC PANEL
ANION GAP: 8 (ref 5–15)
BUN: 14 mg/dL (ref 6–20)
CALCIUM: 9.6 mg/dL (ref 8.9–10.3)
CO2: 20 mmol/L — ABNORMAL LOW (ref 22–32)
Chloride: 109 mmol/L (ref 101–111)
Creatinine, Ser: 1.55 mg/dL — ABNORMAL HIGH (ref 0.61–1.24)
GFR, EST AFRICAN AMERICAN: 60 mL/min — AB (ref >60)
GFR, EST NON AFRICAN AMERICAN: 52 mL/min — AB (ref >60)
GLUCOSE: 119 mg/dL — AB (ref 65–99)
POTASSIUM: 4.2 mmol/L (ref 3.5–5.1)
Sodium: 137 mmol/L (ref 135–145)

## 2015-09-16 MED ORDER — SACUBITRIL-VALSARTAN 24-26 MG PO TABS: 1.0000 | ORAL_TABLET | Freq: Two times a day (BID) | ORAL | Status: AC

## 2015-09-16 MED ORDER — SPIRONOLACTONE 25 MG PO TABS: 12.5000 mg | ORAL_TABLET | Freq: Every day | ORAL | Status: AC

## 2015-09-16 MED ORDER — CITALOPRAM HYDROBROMIDE 20 MG PO TABS: 20.0000 mg | ORAL_TABLET | Freq: Every day | ORAL | Status: AC

## 2015-09-16 MED ORDER — HYDRALAZINE HCL 25 MG PO TABS: 75.0000 mg | ORAL_TABLET | Freq: Three times a day (TID) | ORAL | Status: AC

## 2015-09-16 MED ORDER — FUROSEMIDE 80 MG PO TABS: 80.0000 mg | ORAL_TABLET | Freq: Every day | ORAL | Status: AC

## 2015-09-16 NOTE — Patient Instructions (Addendum)
RESTART Lasix 80 mg, one tba daily RESTART Hydralazine 75 mg, one tab three times a day DECREASE Entresto 24/26 mg, one tab twice a day CHANGE Spironolactone one half tab at bedtime  Labs today  Your physician recommends that you schedule a follow-up appointment in: 2 weeks In the [facility]  Do the following things EVERYDAY: 1) Weigh yourself in the morning before breakfast. Write it down and keep it in a log. 2) Take your medicines as prescribed 3) Eat low salt foods-Limit salt (sodium) to 2000 mg per day.  4) Stay as active as you can everyday 5) Limit all fluids for the day to less than 2 liters 6)

## 2015-09-29 ENCOUNTER — Encounter: Payer: Self-pay | Admitting: Licensed Clinical Social Worker

## 2015-09-29 ENCOUNTER — Ambulatory Visit (HOSPITAL_COMMUNITY)
Admission: RE | Admit: 2015-09-29 | Discharge: 2015-09-29 | Disposition: A | Discharge status: Home / Self Care | Payer: Medicaid Other | Source: Ambulatory Visit | Attending: Cardiology | Admitting: Cardiology

## 2015-09-29 VITALS — BP 158/98 | HR 106 | Wt 171.0 lb

## 2015-09-29 DIAGNOSIS — N183 Chronic kidney disease, stage 3 unspecified: Secondary | ICD-10-CM

## 2015-09-29 DIAGNOSIS — Z79899 Other long term (current) drug therapy: Secondary | ICD-10-CM | POA: Insufficient documentation

## 2015-09-29 DIAGNOSIS — I2510 Atherosclerotic heart disease of native coronary artery without angina pectoris: Secondary | ICD-10-CM | POA: Diagnosis not present

## 2015-09-29 DIAGNOSIS — Z7982 Long term (current) use of aspirin: Secondary | ICD-10-CM | POA: Diagnosis not present

## 2015-09-29 DIAGNOSIS — F4321 Adjustment disorder with depressed mood: Secondary | ICD-10-CM | POA: Diagnosis not present

## 2015-09-29 DIAGNOSIS — I5042 Chronic combined systolic (congestive) and diastolic (congestive) heart failure: Secondary | ICD-10-CM | POA: Insufficient documentation

## 2015-09-29 DIAGNOSIS — I10 Essential (primary) hypertension: Secondary | ICD-10-CM

## 2015-09-29 DIAGNOSIS — Z8249 Family history of ischemic heart disease and other diseases of the circulatory system: Secondary | ICD-10-CM | POA: Diagnosis not present

## 2015-09-29 DIAGNOSIS — I5043 Acute on chronic combined systolic (congestive) and diastolic (congestive) heart failure: Secondary | ICD-10-CM

## 2015-09-29 DIAGNOSIS — Z87891 Personal history of nicotine dependence: Secondary | ICD-10-CM | POA: Diagnosis not present

## 2015-09-29 DIAGNOSIS — I130 Hypertensive heart and chronic kidney disease with heart failure and stage 1 through stage 4 chronic kidney disease, or unspecified chronic kidney disease: Secondary | ICD-10-CM | POA: Insufficient documentation

## 2015-09-29 NOTE — Patient Instructions (Signed)
RESTART Entresto 24/26 mg, one tab twice a day  Labs needed in 2 weeks  Your physician recommends that you schedule a follow-up appointment in: 1 month with Dr. Gala Romney  Do the following things EVERYDAY: 1) Weigh yourself in the morning before breakfast. Write it down and keep it in a log. 2) Take your medicines as prescribed 3) Eat low salt foods-Limit salt (sodium) to 2000 mg per day.  4) Stay as active as you can everyday 5) Limit all fluids for the day to less than 2 liters 6)

## 2015-09-29 NOTE — Progress Notes (Signed)
CSW referred to assist patient with PCP. Patient reports he was working until recently and currently is uninsured. Patient states his mother assisted with a pending medicaid application but not sure of the details. Patient is in need of PCP for other medical issues. CSW referred patient to Social Services for completion of an [place]. Patient verbalizes understanding and plans to go next Monday to complete application. CSW provided card for future need. Lasandra Beech, LCSW [phone]

## 2015-09-29 NOTE — Progress Notes (Signed)
Patient ID: Dwayne Cook, male   DOB: 07-18-1965, 50 y.o.   MRN: [id number]    Advanced Heart Failure Clinic Note   PCP: None Primary Cardiologist: Croituro Primary HF: Dr. Gala Romney   HPI:  Dwayne Cook is a 50 y.o. with past medical history of Malignant HTN and chronic combined HF who preneted to [facility] 08/05/15 with progressive weight gain, dyspnea, and leg swelling. BP in 140s PTA he had continued to work 20-30 hrs a week since the holiday season.   On HF team exam pt was pale and cool with mild speech latency, elevated JVP, +S3, RV heave, + hepatomegaly and 3+ edema. Thought likely to be in low output and PICC requested for CVP and coox. Pt subsequently refused PICC line but did submit to empiric milrinone trial via Peripheral IV.   Echo 08/07/15 LVEF 15%, grade 1 DD, trivial MR, Mod LAE, RV moderately reduced, mild RAE, PA peak pressure 39 mm Hg.   Admitted 4/19 -> 08/13/15. Brisk diuresis noted throughout admission. Had several episodes of NSVT. Hypokalemia down to 2.8 and supped aggressively. He continue to refuse blood draws and progressive treatment, and was regularly withdrawn from staff and providers. Dr. Gala Romney had many frank discussions with pt and his family in regards to his prognosis without medication adherence. D/c weight 162 pounds.   He returns today for regular follow up. At last visit corlanor held and entresto decreased. Unclear if he picked up Entresto. Has been doing poorly over the weekend, His grandmother just passed away. Has held his hydralazine since Saturday with headaches, he has had no headaches since.  But of note, he appears to also have been off of Entrestro. Thinks for several days. Breathing is OK, but with his anxiety from his grandmother passing has been worse again. Will have to travel to [place] for services (he may or may not go, and this is causing even more anxiety). Appetite is OK, picking up more. His mother went back [place] this past weekend, so he  has been taking care of himself. Lives with his daughter and she helps some.   ROS: All systems negative except as listed in HPI, PMH and Problem List.  SH:  Social History   Social History  . Marital Status: Single    Spouse Name: N/A  . Number of Children: N/A  . Years of Education: N/A   Occupational History  . Not on file.   Social History Main Topics  . Smoking status: Former Smoker -- 1.00 packs/day for 15 years    Types: Cigarettes    Quit date: 04/18/2006  . Smokeless tobacco: Never Used  . Alcohol Use: 0.0 - 0.5 oz/week    0-1 Standard drinks or equivalent per week     Comment: occasional per pt  . Drug Use: No  . Sexual Activity: Not on file   Other Topics Concern  . Not on file   Social History Narrative    FH:  Family History  Problem Relation Age of Onset  . Hypertension Mother     Past Medical History  Diagnosis Date  . Dyspnea   . Congestive heart failure (HCC)   . Peripheral edema   . Cardiomegaly   . Hypertension   . Chronic kidney disease   . Coronary artery disease     Current Outpatient Prescriptions  Medication Sig Dispense Refill  . aspirin 81 MG tablet Take 81 mg by mouth daily. Reported on 08/05/2015    . digoxin (LANOXIN) 0.125 MG  tablet Take 1 tablet (0.125 mg total) by mouth daily. 30 tablet 2  . furosemide (LASIX) 80 MG tablet Take 1 tablet (80 mg total) by mouth daily. 30 tablet 6  . metolazone (ZAROXOLYN) 2.5 MG tablet Take 2.5 mg by mouth daily as needed. Take one tablet as needed    . potassium chloride SA (KLOR-CON M20) 20 MEQ tablet Take 1 tablet (20 mEq total) by mouth daily. 30 tablet 2  . spironolactone (ALDACTONE) 25 MG tablet Take 0.5 tablets (12.5 mg total) by mouth at bedtime. 30 tablet 2  . citalopram (CELEXA) 20 MG tablet Take 1 tablet (20 mg total) by mouth daily. 30 tablet 6  . hydrALAZINE (APRESOLINE) 25 MG tablet Take 3 tablets (75 mg total) by mouth 3 (three) times daily. (Patient not taking: Reported on  09/29/2015)    . sacubitril-valsartan (ENTRESTO) 24-26 MG Take 1 tablet by mouth 2 (two) times daily. 60 tablet 2   No current facility-administered medications for this encounter.    Filed Vitals:   09/29/15 1137  BP: 158/98  Pulse: 106  Weight: 171 lb (77.565 kg)  SpO2: 100%   Wt Readings from Last 3 Encounters:  09/29/15 171 lb (77.565 kg)  09/16/15 172 lb 3.2 oz (78.109 kg)  08/18/15 164 lb 8 oz (74.617 kg)     PHYSICAL EXAM:  General: Well appearing No resp difficulty. In WC. Brother present.  HEENT: normal Neck: supple. JVP 7-8. Carotids 2+ bilat; no bruits. No thyromegaly or lymphadenopathy noted Cor: PMI laterally displaced. Tachy regular +s3 2/6 MR.  Lungs: Clear Abdomen: soft, NT, ND, no HSM. No bruits or masses. +BS  Extremities: no cyanosis, clubbing, rash, Minimal ankle edema Neuro: alert & orientedx3, cranial nerves grossly intact. moves all 4 extremities w/o difficulty. Affect pleasant today, usually flat.   ASSESSMENT & PLAN:  1. Chronic combined HF and cardiogenic shock - NYHA IIIB-IV 2. Malignant HTN 3. CKD stage 3 4. Depression, situational  Remains very tenuous, though actually looks better than he has over the past several visits.  NYHA IIIB.  Needs to start back on Entresto 24/26 bid. Will have Pharm-D go over procedure of refilling through patient assistance program.   Continue lasix 80 mg daily.  Hasn't needed any metolazone. Volume status seems stable.  Previously had long talks about need to proceed with work-up for advanced therapies. He continues to request holding off on [facility] for now.   He doesn't have PCP. Will have CSW see today. Needs to establish.  BMET 2 weeks with resuming Entresto and follow up with DB in 4 weeks.   Graciella Freer, PA-C 12:02 PM

## 2015-09-29 NOTE — Progress Notes (Signed)
Advanced Heart Failure Medication Review by a Pharmacist  Does the patient  feel that his/her medications are working for him/her?  yes  Has the patient been experiencing any side effects to the medications prescribed?  Yes, patient states that he experiences headaches when taking hydralazine.   Does the patient measure his/her own blood pressure or blood glucose at home?  no   Does the patient have any problems obtaining medications due to transportation or finances?   no  Understanding of regimen: poor Understanding of indications: poor Potential of compliance: fair Patient understands to avoid NSAIDs. Patient understands to avoid decongestants.   Pharmacist comments: MR. Dwayne Cook is a pleasant 49 yom presenting to clinic for a follow-up appointment. He states that he has been having headaches when he takes his hydralazine. I counseled him that this is a side effects that will usually improve over time.   He states that his mother has been helping him with his medications but that she has recently moved back to [place]. She was filling his pillbox for him and picking up his medications from the pharmacy. He has a very poor understanding of his medication regimen and is not sure of exactly which medications he has and has not been taking. According to previous HF notes, patient should be taking entresto and corlanor (pharmacist completed paperwork and got medications approved for patient for free and meds are supposed to be sent to patient's home). However, patient did not have either of these medication bottles with him in the clinic today. [name] states that he wants to become more familiar with his medication regimen in order to better take care of himself now that his mother has moved.   He also states that he received some "very bad news" this weekend and wants to know if he can be given something to help with anxiety. He was prescribed citalopram upon hospital discharge on 09/16/15  but did not have this bottle with him either.    Time with patient: 15 min  Preparation and documentation time: 5 min  Total time: 20 min   Juwann Sherk C. Marvis Moeller, PharmD Pharmacy Resident  Pager: [phone] 09/29/2015 12:43 PM

## 2015-10-12 ENCOUNTER — Ambulatory Visit (HOSPITAL_COMMUNITY)
Admission: RE | Admit: 2015-10-12 | Discharge: 2015-10-12 | Disposition: A | Discharge status: Home / Self Care | Payer: Medicaid Other | Source: Ambulatory Visit | Attending: Cardiology | Admitting: Cardiology

## 2015-10-12 DIAGNOSIS — I5043 Acute on chronic combined systolic (congestive) and diastolic (congestive) heart failure: Secondary | ICD-10-CM | POA: Insufficient documentation

## 2015-10-12 LAB — BASIC METABOLIC PANEL
Anion gap: 10 (ref 5–15)
BUN: 15 mg/dL (ref 6–20)
CALCIUM: 9.7 mg/dL (ref 8.9–10.3)
CHLORIDE: 107 mmol/L (ref 101–111)
CO2: 23 mmol/L (ref 22–32)
Creatinine, Ser: 1.77 mg/dL — ABNORMAL HIGH (ref 0.61–1.24)
GFR calc Af Amer: 51 mL/min — ABNORMAL LOW (ref >60)
GFR, EST NON AFRICAN AMERICAN: 44 mL/min — AB (ref >60)
GLUCOSE: 91 mg/dL (ref 65–99)
Potassium: 3.5 mmol/L (ref 3.5–5.1)
SODIUM: 140 mmol/L (ref 135–145)

## 2015-10-12 MED FILL — CITALOPRAM HBR 20 MG TABLET: 20 | 30 days supply | Qty: 30 | Fill #0

## 2015-10-26 ENCOUNTER — Encounter (HOSPITAL_COMMUNITY): Payer: Self-pay | Admitting: Cardiology

## 2015-10-27 ENCOUNTER — Telehealth (HOSPITAL_COMMUNITY): Payer: Self-pay | Admitting: Vascular Surgery

## 2015-10-27 NOTE — Telephone Encounter (Signed)
Left pt message to reschedule 11/12/15 appt or keep appt to see [facility]

## 2015-11-12 MED FILL — KLOR-CON M20 TABLET: 20 | 30 days supply | Qty: 30 | Fill #1

## 2015-11-12 MED FILL — DIGITEK 125 MCG TABLET: 125 | 30 days supply | Qty: 30 | Fill #1

## 2015-11-13 ENCOUNTER — Encounter (HOSPITAL_COMMUNITY): Payer: Self-pay | Admitting: Internal Medicine

## 2015-11-18 ENCOUNTER — Encounter (HOSPITAL_COMMUNITY): Payer: Self-pay

## 2015-12-01 ENCOUNTER — Inpatient Hospital Stay (HOSPITAL_COMMUNITY): Admission: RE | Admit: 2015-12-01 | Payer: Self-pay | Source: Ambulatory Visit

## 2015-12-07 ENCOUNTER — Encounter (HOSPITAL_COMMUNITY): Payer: Self-pay | Admitting: Internal Medicine

## 2015-12-18 DEATH — deceased

## 2016-01-01 ENCOUNTER — Encounter (HOSPITAL_COMMUNITY): Payer: Self-pay

## 2016-01-01 NOTE — Progress Notes (Signed)
Received death certificate from [place] and [employer] on behalf of deceased patient. Pending signature from Dr. Gala Romney.  Ave Filter, RN

## 2016-01-06 NOTE — Progress Notes (Signed)
DC signed by Dr Gala Romney, Ivor Messier and [facility] aware and will p/u today

## 2017-01-03 IMAGING — DX DG CHEST 2V
2 series · 2 of 2 positions shown · non-contrast
Comparison: 05/20/2009

CLINICAL DATA: Shortness of breath with swelling of the lower
extremities over the last 5 days.

EXAM:
CHEST  2 VIEW

[chest pa]
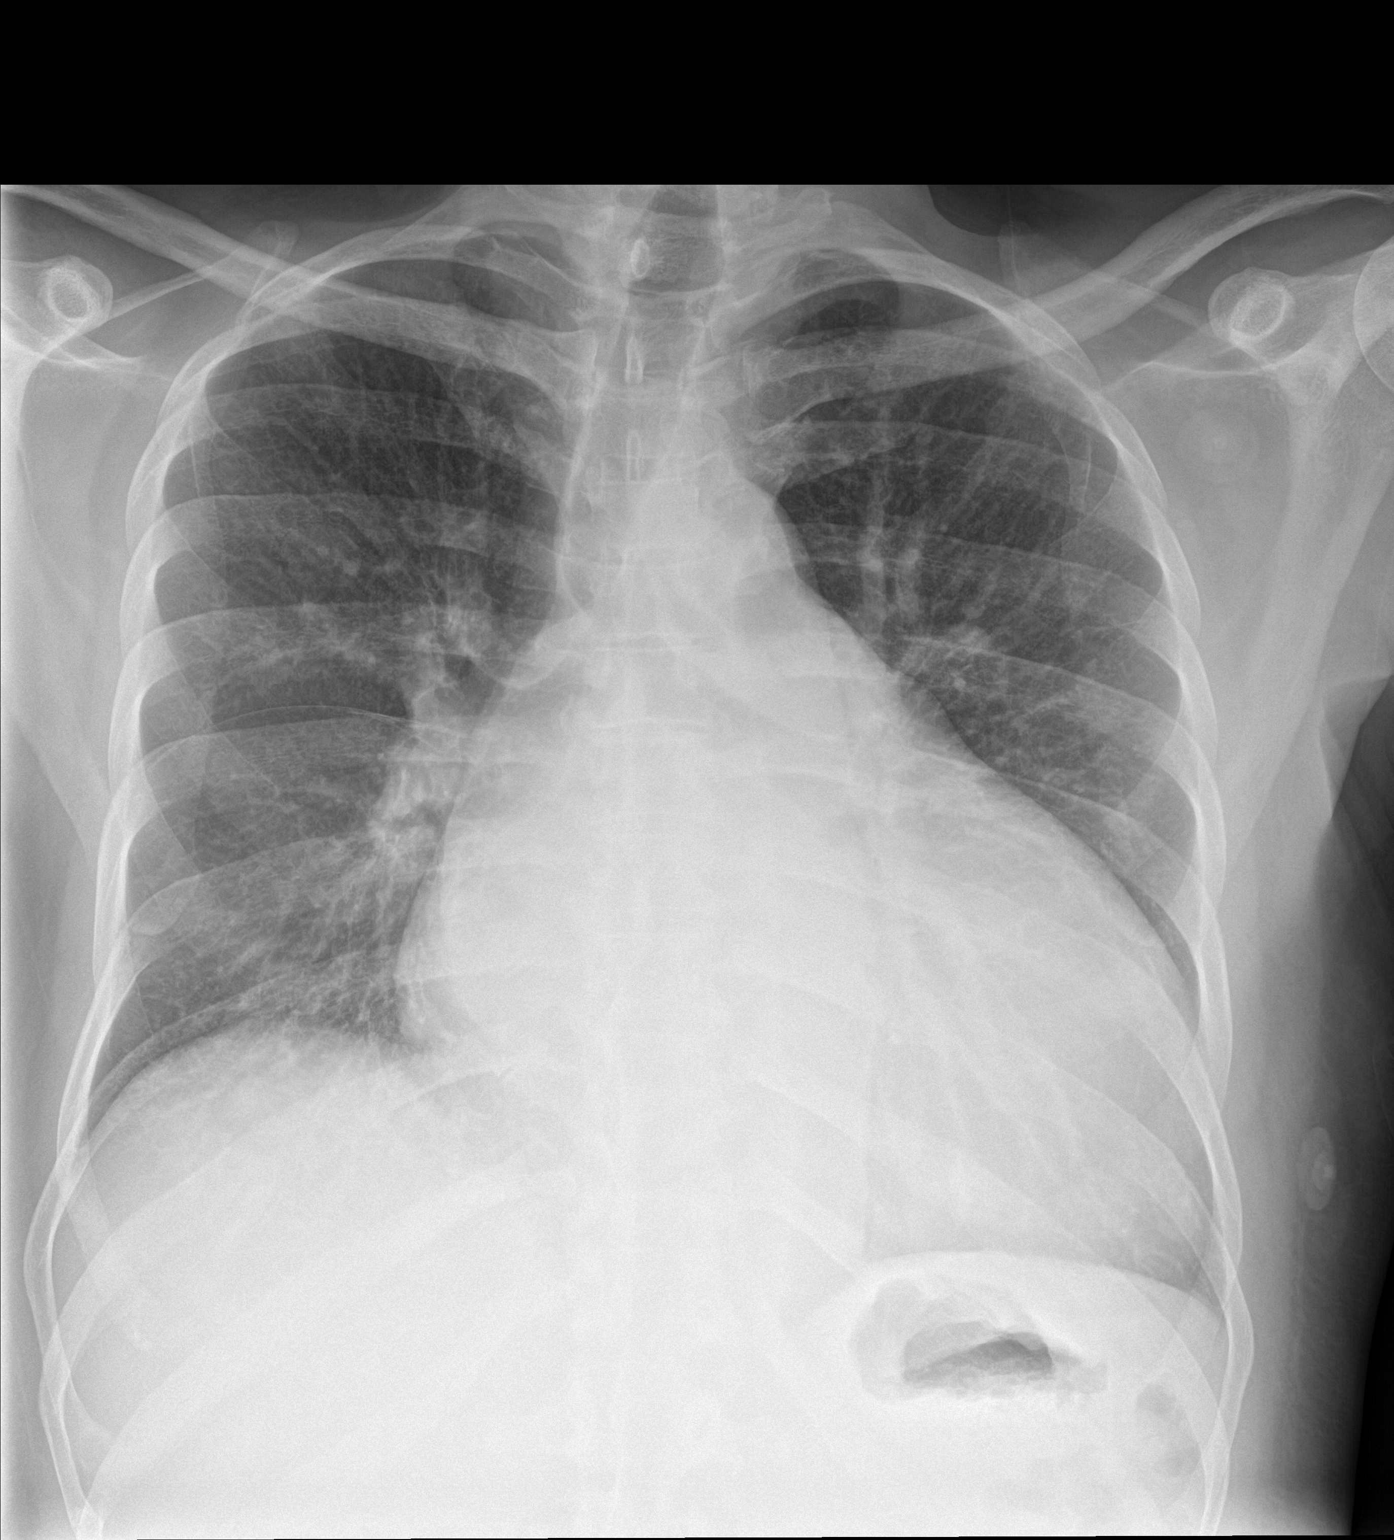

[chest lat]
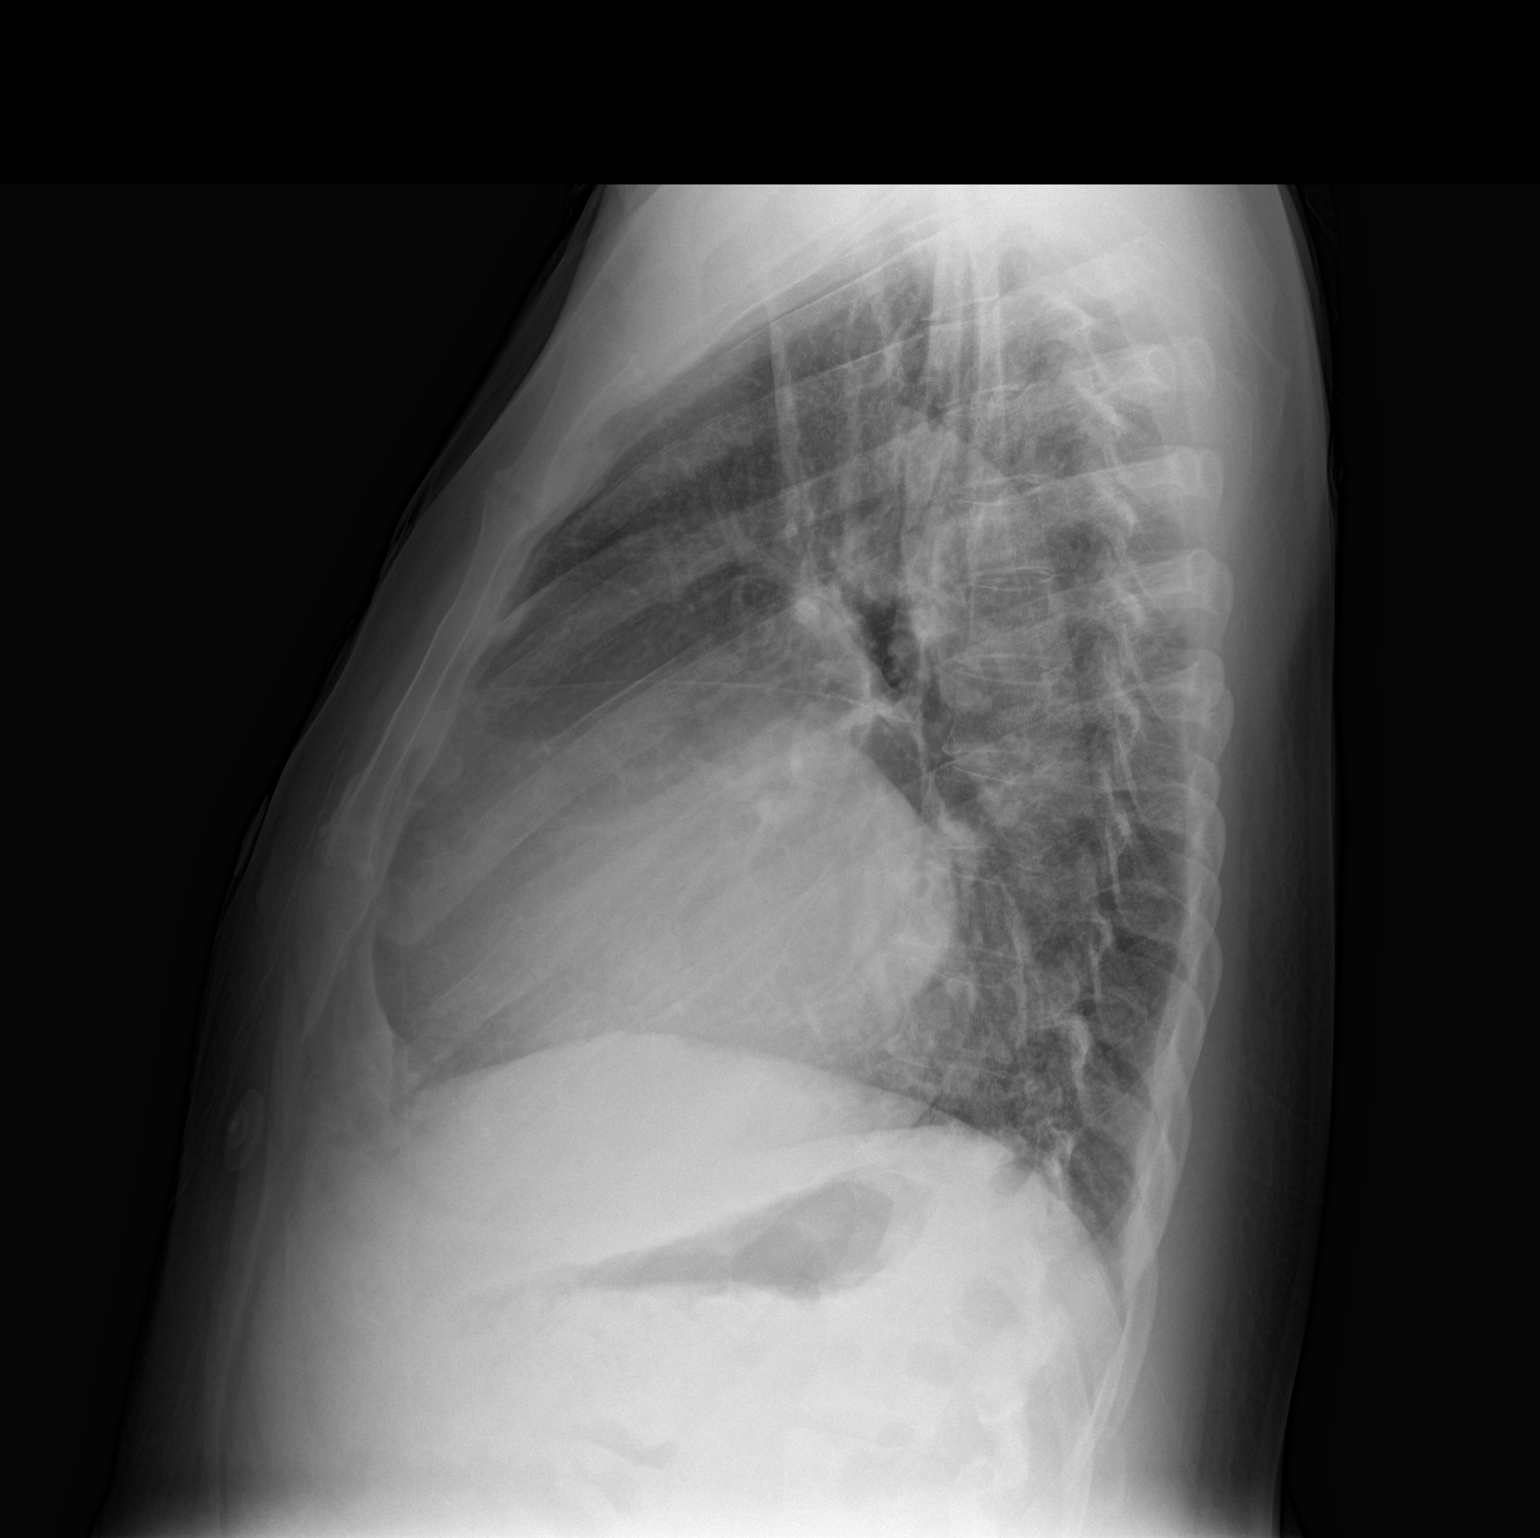

[2 of 2 positions shown; findings below may reference images not displayed]

FINDINGS: Cardiac silhouette is enlarged consistent with cardiomegaly or
pericardial effusion. The aorta is unfolded. There is pulmonary
venous hypertension. No visible interstitial or alveolar edema. Tiny
amount of fluid in the fissures but no measurable pleural effusion.
No significant bone finding.
IMPRESSION: Enlarged cardiac silhouette and pulmonary venous hypertension
consistent with heart failure.
